# Patient Record
Sex: Female | Born: 1955 | Race: White | Hispanic: No | Smoking: Never smoker
Health system: Southern US, Community
[De-identification: ages and names within clinical notes are randomized; demographics above are authoritative.]

## PROBLEM LIST (undated history)

## (undated) DIAGNOSIS — R51 Headache: Secondary | ICD-10-CM

## (undated) DIAGNOSIS — J449 Chronic obstructive pulmonary disease, unspecified: Secondary | ICD-10-CM

## (undated) DIAGNOSIS — M1990 Unspecified osteoarthritis, unspecified site: Secondary | ICD-10-CM

## (undated) DIAGNOSIS — I10 Essential (primary) hypertension: Secondary | ICD-10-CM

## (undated) DIAGNOSIS — N200 Calculus of kidney: Secondary | ICD-10-CM

## (undated) DIAGNOSIS — F329 Major depressive disorder, single episode, unspecified: Secondary | ICD-10-CM

## (undated) DIAGNOSIS — F32A Depression, unspecified: Secondary | ICD-10-CM

## (undated) DIAGNOSIS — K219 Gastro-esophageal reflux disease without esophagitis: Secondary | ICD-10-CM

## (undated) DIAGNOSIS — R0602 Shortness of breath: Secondary | ICD-10-CM

## (undated) HISTORY — PX: KNEE ARTHROSCOPY: SUR90

## (undated) HISTORY — PX: HEMORRHOID SURGERY: SHX153

## (undated) HISTORY — PX: ABDOMINAL HYSTERECTOMY: SHX81

## (undated) HISTORY — PX: CHOLECYSTECTOMY: SHX55

---

## 2007-12-26 ENCOUNTER — Ambulatory Visit (HOSPITAL_COMMUNITY): Admission: RE | Admit: 2007-12-26 | Discharge: 2007-12-26 | Payer: Self-pay | Admitting: Urology

## 2009-06-14 HISTORY — PX: TOTAL KNEE ARTHROPLASTY: SHX125

## 2012-10-19 DIAGNOSIS — R079 Chest pain, unspecified: Secondary | ICD-10-CM

## 2013-02-12 DIAGNOSIS — N200 Calculus of kidney: Secondary | ICD-10-CM

## 2013-02-12 HISTORY — DX: Calculus of kidney: N20.0

## 2013-05-03 ENCOUNTER — Encounter (HOSPITAL_COMMUNITY)
Admission: RE | Admit: 2013-05-03 | Discharge: 2013-05-03 | Disposition: A | Discharge status: Home / Self Care | Payer: PRIVATE HEALTH INSURANCE | Source: Ambulatory Visit | Attending: Orthopaedic Surgery | Admitting: Orthopaedic Surgery

## 2013-05-03 ENCOUNTER — Encounter (HOSPITAL_COMMUNITY): Payer: Self-pay

## 2013-05-03 ENCOUNTER — Encounter (HOSPITAL_COMMUNITY)
Admission: RE | Admit: 2013-05-03 | Discharge: 2013-05-03 | Disposition: A | Discharge status: Home / Self Care | Payer: PRIVATE HEALTH INSURANCE | Source: Ambulatory Visit | Attending: Orthopedic Surgery | Admitting: Orthopedic Surgery

## 2013-05-03 DIAGNOSIS — Z01818 Encounter for other preprocedural examination: Secondary | ICD-10-CM | POA: Insufficient documentation

## 2013-05-03 DIAGNOSIS — Z01812 Encounter for preprocedural laboratory examination: Secondary | ICD-10-CM | POA: Insufficient documentation

## 2013-05-03 DIAGNOSIS — Z01810 Encounter for preprocedural cardiovascular examination: Secondary | ICD-10-CM | POA: Insufficient documentation

## 2013-05-03 HISTORY — DX: Gastro-esophageal reflux disease without esophagitis: K21.9

## 2013-05-03 HISTORY — DX: Major depressive disorder, single episode, unspecified: F32.9

## 2013-05-03 HISTORY — DX: Unspecified osteoarthritis, unspecified site: M19.90

## 2013-05-03 HISTORY — DX: Depression, unspecified: F32.A

## 2013-05-03 HISTORY — DX: Calculus of kidney: N20.0

## 2013-05-03 HISTORY — DX: Chronic obstructive pulmonary disease, unspecified: J44.9

## 2013-05-03 HISTORY — DX: Shortness of breath: R06.02

## 2013-05-03 HISTORY — DX: Essential (primary) hypertension: I10

## 2013-05-03 HISTORY — DX: Headache: R51

## 2013-05-03 LAB — URINE MICROSCOPIC-ADD ON

## 2013-05-03 LAB — CBC
Hemoglobin: 13.3 g/dL (ref 12.0–15.0)
MCHC: 34.3 g/dL (ref 30.0–36.0)
Platelets: 249 10*3/uL (ref 150–400)
RBC: 4.41 MIL/uL (ref 3.87–5.11)

## 2013-05-03 LAB — URINALYSIS, ROUTINE W REFLEX MICROSCOPIC
Bilirubin Urine: NEGATIVE
Glucose, UA: NEGATIVE mg/dL
Hgb urine dipstick: NEGATIVE
Ketones, ur: NEGATIVE mg/dL
Specific Gravity, Urine: 1.015 (ref 1.005–1.030)
pH: 7.5 (ref 5.0–8.0)

## 2013-05-03 LAB — COMPREHENSIVE METABOLIC PANEL
ALT: 16 U/L (ref 0–35)
AST: 17 U/L (ref 0–37)
Alkaline Phosphatase: 99 U/L (ref 39–117)
GFR calc Af Amer: >90 mL/min (ref >90)
Glucose, Bld: 91 mg/dL (ref 70–99)
Potassium: 3.5 mEq/L (ref 3.5–5.1)
Sodium: 141 mEq/L (ref 135–145)
Total Protein: 7.6 g/dL (ref 6.0–8.3)

## 2013-05-03 LAB — APTT: aPTT: 38 seconds — ABNORMAL HIGH (ref 24–37)

## 2013-05-03 LAB — SURGICAL PCR SCREEN
MRSA, PCR: NEGATIVE
Staphylococcus aureus: NEGATIVE

## 2013-05-03 NOTE — Progress Notes (Signed)
PCP: Dr. Clelia Croft in [place], [place]  States had a stress test at [facility] 2014 due to blood pressure being up/down and sob. Will request. No problems now.  Pt. States surgery is on left knee not the right as per what orders indicate. Left message at Dr. Hoy Register office for clarification.

## 2013-05-03 NOTE — Pre-Procedure Instructions (Signed)
Leah Duncan  05/03/2013   Your procedure is scheduled on:  December 2,2014  Report to [facility] Main Entrance "A" at 11:30 AM.  Call this number if you have problems the morning of surgery: [phone]   Remember:   Do not eat food or drink liquids after midnight.   Take these medicines the morning of surgery with A SIP OF WATER: tylenol, protonix, tramadol   Do not wear jewelry, make-up or nail polish.  Do not wear lotions, powders, or perfumes. You may wear deodorant.  Do not shave 48 hours prior to surgery. Men may shave face and neck.  Do not bring valuables to the hospital.  [facility] is not responsible                  for any belongings or valuables.               Contacts, dentures or bridgework may not be worn into surgery.  Leave suitcase in the car. After surgery it may be brought to your room.  For patients admitted to the hospital, discharge time is determined by your                treatment team.               Patients discharged the day of surgery will not be allowed to drive  home.  Name and phone number of your driver:   Special Instructions: Shower using CHG 2 nights before surgery and the night before surgery.  If you shower the day of surgery use CHG.  Use special wash - you have one bottle of CHG for all showers.  You should use approximately 1/3 of the bottle for each shower.   Please read over the following fact sheets that you were given: Pain Booklet, Coughing and Deep Breathing, Blood Transfusion Information, Total Joint Packet, MRSA Information and Surgical Site Infection Prevention

## 2013-05-04 LAB — URINE CULTURE

## 2013-05-07 NOTE — Progress Notes (Signed)
Urine culture to be re collected day of surgery. Orders entered under sign and held.

## 2013-05-14 NOTE — H&P (Signed)
CHIEF COMPLAINT:  Painful left total knee replacement.   HISTORY OF PRESENT ILLNESS:  Leah Duncan is a very pleasant 57 year old white female who is seen today for evaluation of her left knee.  She apparently has had a left total knee replacement by Dr. Herbert Moors in 2011.  She states that she has never been satisfied with the results of this.  She has had persistent swelling and aching pain which worsens at the end of the day.  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  She is on her feet a lot and she actually started to notice now that she is having problems where she is tripping and falling.  The pain has not gotten to the point where she is having pain and discomfort sometimes with every step.  She has been seen by Dr. Turner Daniels, Dr. Charlett Blake, and Dr. Ophelia Charter.  She also has been seen by Dr. [name] and he has obtained multiple tests in regard to her knee.  She has C-reactive protein and a sed rate which were considered both normal.  She has a normal white count in her blood.  She has a small amount of bacteria in urine.  A 3 phase bone scan revealed hyperemia in the left knee which was not present on a prior study in 2013 was centered on the distal femur.  Blood pool uptake was primarily along the distal femur adjacent to the femoral component.  This was similar to a prior exam, but the late activity showed more uniform uptake in the distal femur and proximal tibia and patella.  It is more intense than it was on the right knee.  There is a suspicion of whether there was loosening in the femoral component.  She has been continuing to have significant pain and discomfort in the knee to the point where it is impeding upon her ability to earn a living.  She is having difficulty with activity of daily living.  Continues to have pain which she grades as moderately severe, more of an aching throbbing with occasional stabbing pain.  She denies any neurovascular compromise.  Seen back today for re-evaluation.  Her general  health is good.     HOSPITALIZATIONS:  For renal calculi, cystoscopy and lithotripsy.  Left total knee arthroplasty 2011.  Hemorrhoidectomy.   CURRENT MEDICATIONS:   Hydrocodone 1-2 tablets daily.  Tramadol 1-2 daily.  Protonix twice daily.  Amitriptyline 10 mg nightly.  Blood pressure pill, she is not sure of the name, but it is 10 mg.   ALLERGIES:    None known.   REVIEW OF SYSTEMS:   A 14-point review of systems is positive for COPD which is exacerbated when she is around a wood stove.  She has had hypertension for the past 1 to 1-1/2 years and is treated with unknown blood pressure tablet.  She does have occasional leg cramps.  She has had renal calculi and the last one was 7 months ago.  She urinates 4-5 times a day because of increased fluid intake.  Nocturia x2.  She does have migraines, but her last one was a year ago.     FAMILY HISTORY:   Positive for her mother who is still alive at age 1 with hypertension.  Father who is deceased of unknown age from COPD and lung disease.  She does have a brother age 25 and sisters at age 80 and 72.  Diabetes is noted on the sisters.   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  She denies history of tobacco or alcohol.   PHYSICAL EXAMINATION:  A 57 year old white female, well-developed, well-nourished, alert, pleasant, and cooperative in moderate distress secondary to left knee pain.  Her height is 5 feet 0 inches.  Weight 172 pounds.  BMI was 33.6.  Vital signs reveal a temperature of 98.8, pulse 85, respirations 16, blood pressure 126/73.     Head:  There is normocephalic.   Eyes:  Pupils equal, round, reactive to light and accommodation with extraocular movements intact.  Ears, nose and throat were benign. Chest had good expansion. Lungs were essentially clear.   Cardiac had a regular rhythm and rate.  Normal S1, S2.  No discreet murmurs, rubs or gallops appreciated.   Pulses were  1+ bilateral and symmetric.   Abdomen:  Scaphoid is soft, nontender, no mass is palpable.  Normal bowel sounds are present. Genital, rectal and breast exam was not indicated for the procedure.  CNS:  Oriented x3 and cranial nerves II-XII grossly intact.    Musculoskeletal:  Today she has range of motion from 0 degrees to 90 degrees.  She does have some crepitus with range of motion.  Trace to 1+ effusion.  Calf is supple and nontender.  Neurovascularly intact distally.   CLINICAL IMPRESSION:    Failed left total knee arthroplasty.   RECOMMENDATIONS:  At this time I have reviewed an evaluation by Dr. Clelia Croft for clearance and he states she is cleared from a medical and cardiac standpoint.     With her desire to consider total knee revision on the left because of persistent pain, we will plan on doing this in the very near future.  Procedure, risk and benefits were fully explained to her and she is understanding.  I demonstrated the prosthesis that we would be using in regard to the metal and the plastic.  All questions were answered.  She is understanding and would like to proceed with the surgery in the near future.  Oris Drone Aleda Grana [facility] [phone]  05/14/2013 2:40 PM

## 2013-05-15 ENCOUNTER — Inpatient Hospital Stay (HOSPITAL_COMMUNITY)
Admission: RE | Admit: 2013-05-15 | Discharge: 2013-05-18 | DRG: 467 | Disposition: A | Discharge status: Home Health | Payer: PRIVATE HEALTH INSURANCE | Source: Ambulatory Visit | Attending: Orthopaedic Surgery | Admitting: Orthopaedic Surgery

## 2013-05-15 ENCOUNTER — Encounter (HOSPITAL_COMMUNITY)
Admission: RE | Disposition: A | Discharge status: Home Health | Payer: Self-pay | Source: Ambulatory Visit | Attending: Orthopaedic Surgery

## 2013-05-15 ENCOUNTER — Ambulatory Visit (HOSPITAL_COMMUNITY): Payer: PRIVATE HEALTH INSURANCE | Admitting: Anesthesiology

## 2013-05-15 ENCOUNTER — Encounter (HOSPITAL_COMMUNITY): Payer: Self-pay | Admitting: Surgery

## 2013-05-15 ENCOUNTER — Encounter (HOSPITAL_COMMUNITY): Payer: PRIVATE HEALTH INSURANCE | Admitting: Anesthesiology

## 2013-05-15 DIAGNOSIS — Z96659 Presence of unspecified artificial knee joint: Secondary | ICD-10-CM

## 2013-05-15 DIAGNOSIS — I10 Essential (primary) hypertension: Secondary | ICD-10-CM | POA: Diagnosis present

## 2013-05-15 DIAGNOSIS — T84099A Other mechanical complication of unspecified internal joint prosthesis, initial encounter: Principal | ICD-10-CM | POA: Diagnosis present

## 2013-05-15 DIAGNOSIS — K219 Gastro-esophageal reflux disease without esophagitis: Secondary | ICD-10-CM | POA: Diagnosis present

## 2013-05-15 DIAGNOSIS — J4489 Other specified chronic obstructive pulmonary disease: Secondary | ICD-10-CM | POA: Diagnosis present

## 2013-05-15 DIAGNOSIS — T8484XD Pain due to internal orthopedic prosthetic devices, implants and grafts, subsequent encounter: Secondary | ICD-10-CM

## 2013-05-15 DIAGNOSIS — F3289 Other specified depressive episodes: Secondary | ICD-10-CM | POA: Diagnosis present

## 2013-05-15 DIAGNOSIS — Y831 Surgical operation with implant of artificial internal device as the cause of abnormal reaction of the patient, or of later complication, without mention of misadventure at the time of the procedure: Secondary | ICD-10-CM | POA: Diagnosis present

## 2013-05-15 DIAGNOSIS — Z79899 Other long term (current) drug therapy: Secondary | ICD-10-CM

## 2013-05-15 DIAGNOSIS — D62 Acute posthemorrhagic anemia: Secondary | ICD-10-CM | POA: Diagnosis not present

## 2013-05-15 DIAGNOSIS — F329 Major depressive disorder, single episode, unspecified: Secondary | ICD-10-CM | POA: Diagnosis present

## 2013-05-15 DIAGNOSIS — T8484XA Pain due to internal orthopedic prosthetic devices, implants and grafts, initial encounter: Secondary | ICD-10-CM

## 2013-05-15 DIAGNOSIS — J449 Chronic obstructive pulmonary disease, unspecified: Secondary | ICD-10-CM | POA: Diagnosis present

## 2013-05-15 DIAGNOSIS — T84093A Other mechanical complication of internal left knee prosthesis, initial encounter: Secondary | ICD-10-CM

## 2013-05-15 HISTORY — PX: TOTAL KNEE REVISION: SHX996

## 2013-05-15 HISTORY — PX: TOTAL KNEE ARTHROPLASTY: SHX125

## 2013-05-15 LAB — URINALYSIS, ROUTINE W REFLEX MICROSCOPIC
Glucose, UA: NEGATIVE mg/dL
Hgb urine dipstick: NEGATIVE
Ketones, ur: NEGATIVE mg/dL
Nitrite: NEGATIVE
Specific Gravity, Urine: 1.013 (ref 1.005–1.030)
Urobilinogen, UA: 0.2 mg/dL (ref 0.0–1.0)

## 2013-05-15 LAB — URINE MICROSCOPIC-ADD ON

## 2013-05-15 SURGERY — TOTAL KNEE REVISION
Anesthesia: Regional | Site: Knee | Laterality: Left

## 2013-05-15 MED ORDER — FENTANYL CITRATE 0.05 MG/ML IJ SOLN
INTRAMUSCULAR | Status: AC
  Administered 2013-05-15: 50 ug via INTRAVENOUS
  Filled 2013-05-15: qty 2

## 2013-05-15 MED ORDER — AMITRIPTYLINE HCL 10 MG PO TABS
10.0000 mg | ORAL_TABLET | Freq: Every day | ORAL | Status: DC
  Administered 2013-05-15 – 2013-05-17 (×3): 10 mg via ORAL
  Filled 2013-05-15 (×4): qty 1

## 2013-05-15 MED ORDER — MIDAZOLAM HCL 5 MG/5ML IJ SOLN
INTRAMUSCULAR | Status: DC | PRN
  Administered 2013-05-15: 2 mg via INTRAVENOUS

## 2013-05-15 MED ORDER — PANTOPRAZOLE SODIUM 40 MG PO TBEC
40.0000 mg | DELAYED_RELEASE_TABLET | Freq: Two times a day (BID) | ORAL | Status: DC
  Administered 2013-05-15 – 2013-05-18 (×6): 40 mg via ORAL
  Filled 2013-05-15 (×6): qty 1

## 2013-05-15 MED ORDER — FENTANYL CITRATE 0.05 MG/ML IJ SOLN
INTRAMUSCULAR | Status: DC | PRN
  Administered 2013-05-15: 25 ug via INTRAVENOUS
  Administered 2013-05-15 (×2): 50 ug via INTRAVENOUS
  Administered 2013-05-15: 25 ug via INTRAVENOUS
  Administered 2013-05-15: 100 ug via INTRAVENOUS

## 2013-05-15 MED ORDER — CHLORHEXIDINE GLUCONATE 4 % EX LIQD: 60.0000 mL | Freq: Once | CUTANEOUS | Status: DC

## 2013-05-15 MED ORDER — PHENOL 1.4 % MT LIQD: 1.0000 | OROMUCOSAL | Status: DC | PRN

## 2013-05-15 MED ORDER — ONDANSETRON HCL 4 MG/2ML IJ SOLN
INTRAMUSCULAR | Status: DC | PRN
  Administered 2013-05-15: 4 mg via INTRAVENOUS

## 2013-05-15 MED ORDER — ONDANSETRON HCL 4 MG/2ML IJ SOLN
4.0000 mg | Freq: Four times a day (QID) | INTRAMUSCULAR | Status: AC | PRN
  Administered 2013-05-15: 4 mg via INTRAVENOUS

## 2013-05-15 MED ORDER — MENTHOL 3 MG MT LOZG: 1.0000 | LOZENGE | OROMUCOSAL | Status: DC | PRN

## 2013-05-15 MED ORDER — SODIUM CHLORIDE 0.9 % IR SOLN
Status: DC | PRN
  Administered 2013-05-15: 3000 mL

## 2013-05-15 MED ORDER — HYDROMORPHONE HCL PF 1 MG/ML IJ SOLN: 0.5000 mg | INTRAMUSCULAR | Status: DC | PRN

## 2013-05-15 MED ORDER — METOCLOPRAMIDE HCL 10 MG PO TABS
5.0000 mg | ORAL_TABLET | Freq: Three times a day (TID) | ORAL | Status: DC | PRN
  Administered 2013-05-17 – 2013-05-18 (×2): 10 mg via ORAL
  Filled 2013-05-15 (×2): qty 1

## 2013-05-15 MED ORDER — FLEET ENEMA 7-19 GM/118ML RE ENEM: 1.0000 | ENEMA | Freq: Once | RECTAL | Status: AC | PRN

## 2013-05-15 MED ORDER — ACETAMINOPHEN 325 MG PO TABS
650.0000 mg | ORAL_TABLET | Freq: Four times a day (QID) | ORAL | Status: DC | PRN
  Administered 2013-05-16: 650 mg via ORAL
  Filled 2013-05-15: qty 2

## 2013-05-15 MED ORDER — CHLORHEXIDINE GLUCONATE 4 % EX LIQD: 60.0000 mL | Freq: Every day | CUTANEOUS | Status: DC

## 2013-05-15 MED ORDER — ACETAMINOPHEN 500 MG PO TABS
1000.0000 mg | ORAL_TABLET | Freq: Once | ORAL | Status: AC
  Administered 2013-05-15: 1000 mg via ORAL

## 2013-05-15 MED ORDER — VANCOMYCIN HCL 1000 MG IV SOLR
1000.0000 mg | Freq: Two times a day (BID) | INTRAVENOUS | Status: DC
  Administered 2013-05-15: 1000 mg via INTRAVENOUS
  Filled 2013-05-15: qty 1000

## 2013-05-15 MED ORDER — RIVAROXABAN 10 MG PO TABS
10.0000 mg | ORAL_TABLET | Freq: Every day | ORAL | Status: DC
  Administered 2013-05-16 – 2013-05-18 (×3): 10 mg via ORAL
  Filled 2013-05-15 (×3): qty 1

## 2013-05-15 MED ORDER — ALUM & MAG HYDROXIDE-SIMETH 200-200-20 MG/5ML PO SUSP: 30.0000 mL | ORAL | Status: DC | PRN

## 2013-05-15 MED ORDER — VANCOMYCIN HCL IN DEXTROSE 1-5 GM/200ML-% IV SOLN
1000.0000 mg | Freq: Two times a day (BID) | INTRAVENOUS | Status: DC
  Filled 2013-05-15 (×2): qty 200

## 2013-05-15 MED ORDER — LISINOPRIL 10 MG PO TABS
10.0000 mg | ORAL_TABLET | Freq: Every day | ORAL | Status: DC
  Administered 2013-05-15 – 2013-05-16 (×2): 10 mg via ORAL
  Filled 2013-05-15 (×5): qty 1

## 2013-05-15 MED ORDER — METHOCARBAMOL 100 MG/ML IJ SOLN
500.0000 mg | Freq: Four times a day (QID) | INTRAVENOUS | Status: DC | PRN
  Filled 2013-05-15: qty 5

## 2013-05-15 MED ORDER — HYDROMORPHONE HCL PF 1 MG/ML IJ SOLN
INTRAMUSCULAR | Status: AC
  Administered 2013-05-15: 0.5 mg via INTRAVENOUS
  Filled 2013-05-15: qty 1

## 2013-05-15 MED ORDER — BUPIVACAINE-EPINEPHRINE PF 0.25-1:200000 % IJ SOLN
INTRAMUSCULAR | Status: DC | PRN
  Administered 2013-05-15: 30 mL

## 2013-05-15 MED ORDER — MIDAZOLAM HCL 2 MG/2ML IJ SOLN
1.0000 mg | INTRAMUSCULAR | Status: DC | PRN
  Administered 2013-05-15: 1 mg via INTRAVENOUS

## 2013-05-15 MED ORDER — SODIUM CHLORIDE 0.9 % IV SOLN
INTRAVENOUS | Status: DC
  Administered 2013-05-15: 21:00:00 via INTRAVENOUS

## 2013-05-15 MED ORDER — KETOROLAC TROMETHAMINE 30 MG/ML IJ SOLN
INTRAMUSCULAR | Status: AC
  Administered 2013-05-15: 15 mg
  Filled 2013-05-15: qty 1

## 2013-05-15 MED ORDER — MAGNESIUM HYDROXIDE 400 MG/5ML PO SUSP: 30.0000 mL | Freq: Every day | ORAL | Status: DC | PRN

## 2013-05-15 MED ORDER — VANCOMYCIN HCL IN DEXTROSE 1-5 GM/200ML-% IV SOLN
1000.0000 mg | Freq: Two times a day (BID) | INTRAVENOUS | Status: AC
  Administered 2013-05-16 – 2013-05-17 (×3): 1000 mg via INTRAVENOUS
  Filled 2013-05-15 (×3): qty 200

## 2013-05-15 MED ORDER — METHOCARBAMOL 500 MG PO TABS
500.0000 mg | ORAL_TABLET | Freq: Four times a day (QID) | ORAL | Status: DC | PRN
  Administered 2013-05-15 – 2013-05-17 (×6): 500 mg via ORAL
  Filled 2013-05-15 (×6): qty 1

## 2013-05-15 MED ORDER — ONDANSETRON HCL 4 MG/2ML IJ SOLN
INTRAMUSCULAR | Status: AC
  Administered 2013-05-15: 4 mg via INTRAVENOUS
  Filled 2013-05-15: qty 2

## 2013-05-15 MED ORDER — OXYCODONE HCL 5 MG PO TABS
5.0000 mg | ORAL_TABLET | ORAL | Status: DC | PRN
  Administered 2013-05-16 – 2013-05-18 (×11): 10 mg via ORAL
  Filled 2013-05-15 (×12): qty 2

## 2013-05-15 MED ORDER — LACTATED RINGERS IV SOLN
INTRAVENOUS | Status: DC
Start: 2013-05-15 — End: 2013-05-15
  Administered 2013-05-15: 12:00:00 via INTRAVENOUS

## 2013-05-15 MED ORDER — OXYCODONE HCL 5 MG/5ML PO SOLN
ORAL | Status: AC
  Administered 2013-05-15: 5 mg via ORAL
  Filled 2013-05-15: qty 5

## 2013-05-15 MED ORDER — LIDOCAINE HCL (CARDIAC) 20 MG/ML IV SOLN
INTRAVENOUS | Status: DC | PRN
  Administered 2013-05-15: 80 mg via INTRAVENOUS

## 2013-05-15 MED ORDER — BUPIVACAINE-EPINEPHRINE (PF) 0.25% -1:200000 IJ SOLN
INTRAMUSCULAR | Status: AC
  Filled 2013-05-15: qty 30

## 2013-05-15 MED ORDER — ARTIFICIAL TEARS OP OINT
TOPICAL_OINTMENT | OPHTHALMIC | Status: DC | PRN
  Administered 2013-05-15: 1 via OPHTHALMIC

## 2013-05-15 MED ORDER — VANCOMYCIN HCL IN DEXTROSE 1-5 GM/200ML-% IV SOLN
INTRAVENOUS | Status: AC
  Filled 2013-05-15: qty 200

## 2013-05-15 MED ORDER — ONDANSETRON HCL 4 MG/2ML IJ SOLN
4.0000 mg | Freq: Four times a day (QID) | INTRAMUSCULAR | Status: DC | PRN
  Administered 2013-05-17: 4 mg via INTRAVENOUS
  Filled 2013-05-15: qty 2

## 2013-05-15 MED ORDER — OXYCODONE HCL 5 MG PO TABS: 5.0000 mg | ORAL_TABLET | Freq: Once | ORAL | Status: AC | PRN

## 2013-05-15 MED ORDER — ACETAMINOPHEN 650 MG RE SUPP: 650.0000 mg | Freq: Four times a day (QID) | RECTAL | Status: DC | PRN

## 2013-05-15 MED ORDER — BISACODYL 10 MG RE SUPP: 10.0000 mg | Freq: Every day | RECTAL | Status: DC | PRN

## 2013-05-15 MED ORDER — ESMOLOL HCL 10 MG/ML IV SOLN
INTRAVENOUS | Status: DC | PRN
  Administered 2013-05-15: 10 mg via INTRAVENOUS

## 2013-05-15 MED ORDER — DOCUSATE SODIUM 100 MG PO CAPS
100.0000 mg | ORAL_CAPSULE | Freq: Two times a day (BID) | ORAL | Status: DC
  Administered 2013-05-15 – 2013-05-18 (×6): 100 mg via ORAL
  Filled 2013-05-15 (×7): qty 1

## 2013-05-15 MED ORDER — HYDROMORPHONE HCL PF 1 MG/ML IJ SOLN
0.5000 mg | INTRAMUSCULAR | Status: AC | PRN
  Administered 2013-05-15 (×4): 0.5 mg via INTRAVENOUS

## 2013-05-15 MED ORDER — FENTANYL CITRATE 0.05 MG/ML IJ SOLN
50.0000 ug | INTRAMUSCULAR | Status: DC | PRN
  Administered 2013-05-15: 50 ug via INTRAVENOUS

## 2013-05-15 MED ORDER — LACTATED RINGERS IV SOLN
INTRAVENOUS | Status: DC | PRN
  Administered 2013-05-15 (×2): via INTRAVENOUS

## 2013-05-15 MED ORDER — OXYCODONE HCL 5 MG/5ML PO SOLN
5.0000 mg | Freq: Once | ORAL | Status: AC | PRN
  Administered 2013-05-15: 5 mg via ORAL

## 2013-05-15 MED ORDER — PROPOFOL 10 MG/ML IV BOLUS
INTRAVENOUS | Status: DC | PRN
  Administered 2013-05-15: 150 mg via INTRAVENOUS

## 2013-05-15 MED ORDER — METOCLOPRAMIDE HCL 5 MG/ML IJ SOLN: 5.0000 mg | Freq: Three times a day (TID) | INTRAMUSCULAR | Status: DC | PRN

## 2013-05-15 MED ORDER — MIDAZOLAM HCL 2 MG/2ML IJ SOLN
INTRAMUSCULAR | Status: AC
  Administered 2013-05-15: 1 mg via INTRAVENOUS
  Filled 2013-05-15: qty 2

## 2013-05-15 MED ORDER — BUPIVACAINE-EPINEPHRINE PF 0.5-1:200000 % IJ SOLN
INTRAMUSCULAR | Status: DC | PRN
  Administered 2013-05-15: 30 mL

## 2013-05-15 MED ORDER — SODIUM CHLORIDE 0.9 % IV SOLN: INTRAVENOUS | Status: DC

## 2013-05-15 MED ORDER — FENTANYL CITRATE 0.05 MG/ML IJ SOLN: 25.0000 ug | INTRAMUSCULAR | Status: DC | PRN

## 2013-05-15 MED ORDER — ACETAMINOPHEN 500 MG PO TABS
ORAL_TABLET | ORAL | Status: AC
  Administered 2013-05-15: 1000 mg via ORAL
  Filled 2013-05-15: qty 2

## 2013-05-15 MED ORDER — ONDANSETRON HCL 4 MG PO TABS: 4.0000 mg | ORAL_TABLET | Freq: Four times a day (QID) | ORAL | Status: DC | PRN

## 2013-05-15 MED ORDER — KETOROLAC TROMETHAMINE 15 MG/ML IJ SOLN
15.0000 mg | Freq: Four times a day (QID) | INTRAMUSCULAR | Status: AC
  Filled 2013-05-15: qty 1

## 2013-05-15 SURGICAL SUPPLY — 63 items
ANCHOR PEEK ALL THREAD (Anchor) ×2 IMPLANT
AUGMENT DIST PFC 4MM (Knees) ×1 IMPLANT
BANDAGE ESMARK 6X9 LF (GAUZE/BANDAGES/DRESSINGS) ×1 IMPLANT
BLADE SAGITTAL 25.0X1.19X90 (BLADE) ×2 IMPLANT
BLADE SAW SGTL 13.0X1.19X90.0M (BLADE) ×2 IMPLANT
BNDG ESMARK 6X9 LF (GAUZE/BANDAGES/DRESSINGS) ×2
BONE CEMENT GENTAMICIN (Cement) ×6 IMPLANT
BOWL SMART MIX CTS (DISPOSABLE) IMPLANT
CEMENT BONE GENTAMICIN 40 (Cement) ×3 IMPLANT
CEMENT RESTRICTOR DEPUY SZ 3 (Cement) ×2 IMPLANT
CLOTH BEACON ORANGE TIMEOUT ST (SAFETY) ×2 IMPLANT
COVER SURGICAL LIGHT HANDLE (MISCELLANEOUS) ×2 IMPLANT
CUFF TOURNIQUET SINGLE 34IN LL (TOURNIQUET CUFF) IMPLANT
CUFF TOURNIQUET SINGLE 44IN (TOURNIQUET CUFF) IMPLANT
DISAL AUG PFC 4MM (Knees) ×2 IMPLANT
DRAPE EXTREMITY T 121X128X90 (DRAPE) ×2 IMPLANT
DRSG ADAPTIC 3X8 NADH LF (GAUZE/BANDAGES/DRESSINGS) ×2 IMPLANT
DRSG PAD ABDOMINAL 8X10 ST (GAUZE/BANDAGES/DRESSINGS) ×2 IMPLANT
DURAPREP 26ML APPLICATOR (WOUND CARE) ×2 IMPLANT
ELECT REM PT RETURN 9FT ADLT (ELECTROSURGICAL) ×2
ELECTRODE REM PT RTRN 9FT ADLT (ELECTROSURGICAL) ×1 IMPLANT
EVACUATOR 1/8 PVC DRAIN (DRAIN) IMPLANT
FACESHIELD LNG OPTICON STERILE (SAFETY) ×4 IMPLANT
FEMORAL ADAPTER (Orthopedic Implant) ×2 IMPLANT
FEMORAL PFC TC3 (Orthopedic Implant) ×2 IMPLANT
GLOVE BIOGEL PI IND STRL 8 (GLOVE) ×1 IMPLANT
GLOVE BIOGEL PI IND STRL 8.5 (GLOVE) IMPLANT
GLOVE BIOGEL PI INDICATOR 8 (GLOVE) ×1
GLOVE BIOGEL PI INDICATOR 8.5 (GLOVE)
GLOVE ECLIPSE 8.0 STRL XLNG CF (GLOVE) ×2 IMPLANT
GLOVE SURG ORTHO 8.5 STRL (GLOVE) ×2 IMPLANT
GOWN PREVENTION PLUS XLARGE (GOWN DISPOSABLE) ×2 IMPLANT
GOWN PREVENTION PLUS XXLARGE (GOWN DISPOSABLE) ×2 IMPLANT
GOWN STRL NON-REIN LRG LVL3 (GOWN DISPOSABLE) ×4 IMPLANT
HANDPIECE INTERPULSE COAX TIP (DISPOSABLE) ×1
INSERT TC3 RP TIBIAL 2.5 20MM (Knees) ×2 IMPLANT
KIT BASIN OR (CUSTOM PROCEDURE TRAY) ×2 IMPLANT
KIT ROOM TURNOVER OR (KITS) ×2 IMPLANT
MANIFOLD NEPTUNE II (INSTRUMENTS) ×2 IMPLANT
NEEDLE 22X1 1/2 (OR ONLY) (NEEDLE) IMPLANT
NS IRRIG 1000ML POUR BTL (IV SOLUTION) ×2 IMPLANT
PACK TOTAL JOINT (CUSTOM PROCEDURE TRAY) ×2 IMPLANT
PAD ARMBOARD 7.5X6 YLW CONV (MISCELLANEOUS) ×4 IMPLANT
PAD CAST 4YDX4 CTTN HI CHSV (CAST SUPPLIES) ×1 IMPLANT
PADDING CAST COTTON 4X4 STRL (CAST SUPPLIES) ×1
PATELLA DOME PFC 38MM (Knees) ×2 IMPLANT
POST AUG PFC 8MM SZ 2.5 (Knees) ×4 IMPLANT
SET HNDPC FAN SPRY TIP SCT (DISPOSABLE) ×1 IMPLANT
SPONGE GAUZE 4X4 12PLY (GAUZE/BANDAGES/DRESSINGS) ×2 IMPLANT
STAPLER VISISTAT 35W (STAPLE) ×2 IMPLANT
STEM TIBIA PFC 13X30MM (Stem) ×2 IMPLANT
STEM UNIVERSAL REVISION 75X14 (Stem) ×2 IMPLANT
SUCTION FRAZIER TIP 10 FR DISP (SUCTIONS) ×2 IMPLANT
SUT BONE WAX W31G (SUTURE) ×2 IMPLANT
SUT ETHIBOND NAB CT1 #1 30IN (SUTURE) ×8 IMPLANT
SUT VIC AB 0 CT1 27 (SUTURE) ×1
SUT VIC AB 0 CT1 27XBRD ANBCTR (SUTURE) ×1 IMPLANT
SUT VIC AB 2-0 FS1 27 (SUTURE) ×4 IMPLANT
SYR CONTROL 10ML LL (SYRINGE) IMPLANT
TOWER CARTRIDGE SMART MIX (DISPOSABLE) ×2 IMPLANT
TRAY FOLEY CATH 16FRSI W/METER (SET/KITS/TRAYS/PACK) ×2 IMPLANT
TRAY TIB SZ 2 REVISION (Knees) ×2 IMPLANT
WATER STERILE IRR 1000ML POUR (IV SOLUTION) ×6 IMPLANT

## 2013-05-15 NOTE — Anesthesia Procedure Notes (Addendum)
Anesthesia Regional Block:  Femoral nerve block  Pre-Anesthetic Checklist: ,, timeout performed, Correct Patient, Correct Site, Correct Laterality, Correct Procedure,, site marked, risks and benefits discussed, Surgical consent,  Pre-op evaluation,  At surgeon's request and post-op pain management  Laterality: Left  Prep: chloraprep       Needles:  Injection technique: Single-shot  Needle Type: Echogenic Stimulator Needle     Needle Length: 9cm  Needle Gauge: 21 and 21 G    Additional Needles:  Procedures: nerve stimulator Femoral nerve block  Nerve Stimulator or Paresthesia:  Response: Quadriceps muscle contraction, 0.45 mA,   Additional Responses:   Narrative:  Start time: 05/15/2013 12:47 PM End time: 05/15/2013 1:01 PM Injection made incrementally with aspirations every 5 mL.  Performed by: Personally  Anesthesiologist: Dr Chaney Malling  Additional Notes: Functioning IV was confirmed and monitors were applied.  A 90mm 21ga Arrow echogenic stimulator needle was used. Sterile prep and drape,hand hygiene and sterile gloves were used.  Negative aspiration and negative test dose prior to incremental administration of local anesthetic. The patient tolerated the procedure well.     Procedure Name: LMA Insertion Date/Time: 05/15/2013 1:53 PM Performed by: Marena Chancy Pre-anesthesia Checklist: Patient identified, Patient being monitored, Timeout performed, Emergency Drugs available and Suction available Patient Re-evaluated:Patient Re-evaluated prior to inductionOxygen Delivery Method: Circle system utilized Preoxygenation: Pre-oxygenation with 100% oxygen Intubation Type: IV induction LMA: LMA inserted LMA Size: 4.0 Number of attempts: 1 Placement Confirmation: breath sounds checked- equal and bilateral and positive ETCO2 Tube secured with: Tape Dental Injury: Teeth and Oropharynx as per pre-operative assessment

## 2013-05-15 NOTE — Preoperative (Signed)
Beta Blockers   Reason not to administer Beta Blockers:Not Applicable 

## 2013-05-15 NOTE — Progress Notes (Signed)
Patient ID: Leah Duncan, female   DOB: 1956/06/03, 57 y.o.   MRN: [id number] The recent History & Physical has been reviewed. I have personally examined the patient today. There is no interval change to the documented History & Physical. The patient would like to proceed with the procedure.  [name] W 05/15/2013,  1:06 PM

## 2013-05-15 NOTE — Transfer of Care (Signed)
Immediate Anesthesia Transfer of Care Note  Patient: Leah Duncan  Procedure(s) Performed: Procedure(s): TOTAL KNEE REVISION (Left)  Patient Location: PACU  Anesthesia Type:General  Level of Consciousness: awake, alert  and oriented  Airway & Oxygen Therapy: Patient Spontanous Breathing and Patient connected to nasal cannula oxygen  Post-op Assessment: Report given to PACU RN, Post -op Vital signs reviewed and stable and Patient moving all extremities X 4  Post vital signs: Reviewed and stable  Complications: No apparent anesthesia complications

## 2013-05-15 NOTE — Progress Notes (Signed)
Orthopedic Tech Progress Note Patient Details:  Leah Duncan 01/23/1956 [id number]  CPM Left Knee CPM Left Knee: On Left Knee Flexion (Degrees): 40 Left Knee Extension (Degrees): 0 Additional Comments: put on ohf on bed   Jennye Moccasin 05/15/2013, 6:43 PM

## 2013-05-15 NOTE — Anesthesia Postprocedure Evaluation (Signed)
Anesthesia Post Note  Patient: Leah Duncan  Procedure(s) Performed: Procedure(s) (LRB): TOTAL KNEE REVISION (Left)  Anesthesia type: General  Patient location: PACU  Post pain: Pain level controlled and Adequate analgesia  Post assessment: Post-op Vital signs reviewed, Patient's Cardiovascular Status Stable, Respiratory Function Stable, Patent Airway and Pain level controlled  Last Vitals:  Filed Vitals:   05/15/13 1745  BP: 113/85  Pulse: 83  Temp:   Resp: 11    Post vital signs: Reviewed and stable  Level of consciousness: awake, alert  and oriented  Complications: No apparent anesthesia complications

## 2013-05-15 NOTE — Op Note (Signed)
PATIENT ID:      Leah Duncan  MRN:     [id number] DOB/AGE:    [birth year] / 57 y.o.       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                                                                     There is no weight on file to calculate BMI.34     PROCEDURE:  Procedure(s): TOTAL KNEE REVISION left     SURGEON:  [name], MD    ASSISTANT:   [name], PA-C   (Present and scrubbed throughout the case, critical for assistance with exposure, retraction, instrumentation, and closure.)          ANESTHESIA: regional and general     DRAINS: (left knee) Hemovact drain(s) in the clamped with  Suction Clamped :      TOURNIQUET TIME: * Missing tourniquet times found for documented tourniquets in log:  [id number] * Total Tourniquet Time Documented: Thigh (Left) - 104 minutes Total: Thigh (Left) - 104 minutes  second tourniquet 22 min   COMPLICATIONS:  None   CONDITION:  stable  PROCEDURE IN DETAIL: 211968   [name], [name] 05/15/2013, 5:09 PM

## 2013-05-15 NOTE — Anesthesia Preprocedure Evaluation (Signed)
Anesthesia Evaluation  Patient identified by MRN, date of birth, ID band Patient awake    Reviewed: Allergy & Precautions, H&P , NPO status , Patient's Chart, lab work & pertinent test results  Airway Mallampati: II  Neck ROM: full    Dental   Pulmonary shortness of breath, COPD         Cardiovascular hypertension,     Neuro/Psych  Headaches, Depression    GI/Hepatic GERD-  ,  Endo/Other  obese  Renal/GU      Musculoskeletal  (+) Arthritis -,   Abdominal   Peds  Hematology   Anesthesia Other Findings   Reproductive/Obstetrics                           Anesthesia Physical Anesthesia Plan  ASA: II  Anesthesia Plan: General and Regional   Post-op Pain Management: MAC Combined w/ Regional for Post-op pain   Induction: Intravenous  Airway Management Planned: LMA  Additional Equipment:   Intra-op Plan:   Post-operative Plan:   Informed Consent: I have reviewed the patients History and Physical, chart, labs and discussed the procedure including the risks, benefits and alternatives for the proposed anesthesia with the patient or authorized representative who has indicated his/her understanding and acceptance.     Plan Discussed with: CRNA, Anesthesiologist and Surgeon  Anesthesia Plan Comments:         Anesthesia Quick Evaluation

## 2013-05-16 ENCOUNTER — Encounter (HOSPITAL_COMMUNITY): Payer: Self-pay | Admitting: General Practice

## 2013-05-16 LAB — URINE CULTURE
Colony Count: NO GROWTH
Special Requests: NORMAL

## 2013-05-16 LAB — BASIC METABOLIC PANEL
CO2: 27 mEq/L (ref 19–32)
Calcium: 8.9 mg/dL (ref 8.4–10.5)
Potassium: 4.8 mEq/L (ref 3.5–5.1)
Sodium: 138 mEq/L (ref 135–145)

## 2013-05-16 LAB — CBC
Hemoglobin: 9 g/dL — ABNORMAL LOW (ref 12.0–15.0)
Hemoglobin: 8.1 g/dL — ABNORMAL LOW (ref 12.0–15.0)
MCH: 30 pg (ref 26.0–34.0)
MCH: 29.7 pg (ref 26.0–34.0)
MCV: 89.7 fL (ref 78.0–100.0)
Platelets: 173 10*3/uL (ref 150–400)
Platelets: 176 10*3/uL (ref 150–400)
RBC: 3 MIL/uL — ABNORMAL LOW (ref 3.87–5.11)
RBC: 2.73 MIL/uL — ABNORMAL LOW (ref 3.87–5.11)
RDW: 14.2 % (ref 11.5–15.5)
WBC: 8.6 10*3/uL (ref 4.0–10.5)
WBC: 8.5 10*3/uL (ref 4.0–10.5)

## 2013-05-16 MED ORDER — KETOROLAC TROMETHAMINE 30 MG/ML IJ SOLN
15.0000 mg | INTRAMUSCULAR | Status: AC
  Administered 2013-05-16: 15 mg via INTRAVENOUS
  Filled 2013-05-16: qty 1

## 2013-05-16 NOTE — Evaluation (Signed)
Physical Therapy Evaluation Patient Details Name: Leah Duncan MRN: [id number] DOB: 08-Jun-1956 Today's Date: 05/16/2013 Time: 4098-1191 PT Time Calculation (min): 16 min  PT Assessment / Plan / Recommendation History of Present Illness  PAINFUL, failed left total knee now s/p Left Total knee Revision.  Clinical Impression  Pt is s/p TKA resulting in the deficits listed below (see PT Problem List).  Pt will benefit from skilled PT to increase their independence and safety with mobility to allow discharge to the venue listed below.      PT Assessment  Patient needs continued PT services    Follow Up Recommendations  Home health PT;Supervision/Assistance - 24 hour    Does the patient have the potential to tolerate intense rehabilitation      Barriers to Discharge        Equipment Recommendations  Rolling walker with 5" wheels;3in1 (PT)    Recommendations for Other Services OT consult   Frequency 7X/week    Precautions / Restrictions Precautions Precautions: Fall;Knee Restrictions LLE Weight Bearing: Partial weight bearing LLE Partial Weight Bearing Percentage or Pounds: 50%   Pertinent Vitals/Pain L knee pain, did not rate; patient repositioned for comfort in CPM Reported nausea      Mobility  Bed Mobility Bed Mobility: Supine to Sit;Sitting - Scoot to Edge of Bed Supine to Sit: 3: Mod assist;HOB flat;With rails Sitting - Scoot to Edge of Bed: 3: Mod assist;With rail Details for Bed Mobility Assistance: Assist for LE and safety/sequencing of all mobility and transfers Transfers Transfers: Sit to Stand;Stand to Sit Sit to Stand: From bed;With upper extremity assist;With armrests;4: Min assist Stand to Sit: To chair/3-in-1;With upper extremity assist;4: Min assist Details for Transfer Assistance: VC's for safety, sequencing and hand placement w/ RW. Pt reaching to chair w/ one hand on RW x4 despite maximal verbal and tactile cues for hand placement. Pt currently not  safe w/ SPT, this could be medication related, cont to assess. Ambulation/Gait Ambulation/Gait Assistance: 3: Mod assist Ambulation Distance (Feet): 6 Feet Assistive device: Rolling walker Ambulation/Gait Assistance Details: Cues for gait sequence, posture and correct holding of RW through grips for bearing down with UEs into RW to unweight LLE for 50% PWB; pt tending to lean down onto rW with elbows; Amb distance limited by nausea; Required mod phsyical assist to advance RW Gait Pattern: Step-to pattern    Exercises Total Joint Exercises Ankle Circles/Pumps: AROM;Both;10 reps Quad Sets: AROM;Left;10 reps Heel Slides: AAROM;Left;10 reps Straight Leg Raises: AAROM;Left;5 reps   PT Diagnosis: Difficulty walking;Acute pain  PT Problem List: Decreased strength;Decreased range of motion;Decreased activity tolerance;Decreased balance;Decreased mobility;Decreased knowledge of use of DME;Pain;Decreased knowledge of precautions;Decreased safety awareness PT Treatment Interventions: DME instruction;Gait training;Stair training;Functional mobility training;Therapeutic activities;Therapeutic exercise;Patient/family education     PT Goals(Current goals can be found in the care plan section) Acute Rehab PT Goals Patient Stated Goal: Home w/ therapy and PRN assist from mother/family PT Goal Formulation: With patient Time For Goal Achievement: 05/23/13 Potential to Achieve Goals: Good  Visit Information  Last PT Received On: 05/16/13 Assistance Needed: +1 (he+2lpful to push chair) Reason Eval/Treat Not Completed: Patient declined, no reason specified History of Present Illness: PAINFUL, failed left total knee now s/p Left Total knee Revision.       Prior Functioning  Home Living Family/patient expects to be discharged to:: Private residence Living Arrangements: Parent;Other relatives Available Help at Discharge: Family;Available 24 hours/day Type of Home: House Home Access: Stairs to  enter [employer] of Steps: 1 Home  Layout: One level Home Equipment: Walker - 2 wheels;Bedside commode;Shower seat;Adaptive equipment Adaptive Equipment: Reacher;Sock aid;Long-handled shoe horn;Long-handled sponge Prior Function Level of Independence: Independent Communication Communication: No difficulties Dominant Hand: Right    Cognition  Cognition Arousal/Alertness: Awake/alert;Lethargic Behavior During Therapy: WFL for tasks assessed/performed Overall Cognitive Status: Within Functional Limits for tasks assessed    Extremity/Trunk Assessment Upper Extremity Assessment Upper Extremity Assessment: Overall WFL for tasks assessed Lower Extremity Assessment Lower Extremity Assessment: LLE deficits/detail LLE Deficits / Details: Grossly decr AROM and strength, limited by pain postop; Muscle guarding with passive flexion   Balance    End of Session PT - End of Session Equipment Utilized During Treatment: Gait belt Activity Tolerance: Patient tolerated treatment well Patient left: in bed;in CPM;with call bell/phone within reach Nurse Communication: Mobility status CPM Left Knee CPM Left Knee: On  GP     Olen Pel [place], [place] [phone]  05/16/2013, 2:18 PM

## 2013-05-16 NOTE — Op Note (Signed)
Leah Duncan, Leah Duncan               ACCOUNT NO.:  [id number]  MEDICAL RECORD NO.:  [id number]  LOCATION:  5N13C                        FACILITY:  [facility]  PHYSICIAN:  Claude Manges. 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  PROCEDURE: 1. EXPLORATION LEFT KNEE WITH REMOVAL OF LEFT TOTAL KNEE REPLACEMENT. 2. FROZEN SECTION DEEP SYNOVIUM. 3. REVISION LEFT TOTAL KNEE REPLACEMENT.  SURGEON:  Claude Manges. [name], MD.  ASSISTANT:  [name], PA-C was present throughout the operative procedure to ensure its timely completion.  ANESTHESIA:  General with supplemental left femoral nerve block.  COMPLICATIONS:  None.  COMPONENTS:  A Stryker knee replacement was removed.  I inserted a DePuy Sigma knee revision system.  On the tibial side, there was a #2 tibial tray with a 13 mm x 30 mm modular stem, the bone components were cemented.  I did use a cement restrictor size 3.  On the femoral side, I used a TC3 size 2.5.  I used two 8-mm posterior metallic augments and a 4-mm lateral distal augment and those components were also cemented using gentamicin impregnated cement.  I used a 20-mm polyethylene rotating tibial tray and an oval dome patella 3 PEG 38 mm in size.  PROCEDURE:  Leah Duncan was met in the holding area, identified the left knee as appropriate operative site.  The knee was examined.  It was not hot or red.  It did go into recurvatum and there was some opening medially with the valgus stress.  She did receive a preoperative femoral nerve block per Anesthesia.  I marked the left knee as the appropriate site.  The patient was then  transported to room #7, placed under general endotracheal anesthesia without difficulty.  The nursing staff inserted a Foley catheter.  Urine was clear.  Left thigh tourniquet was then applied.  The leg was prepped with chlorhexidine scrub and then DuraPrep from the tourniquet to the tips of the toes.  Sterile draping was performed.  With the extremity elevated, it was Esmarch exsanguinated with a proximal tourniquet at 350 mmHg.  We did call a time-out.  After Esmarch exsanguination and elevation of tourniquet, a midline longitudinal incision was made about the left knee by using the old incision.  Via sharp dissection, incision was carried down to subcutaneous tissue.  First layer of capsule was incised in the midline. I did not encounter any nonabsorbable suture.  A medial parapatellar incision was then made through the deep capsule with the Bovie.  The joint was entered.  There was a clear yellow joint effusion, this was sent for anaerobic and aerobic cultures.  The patella was everted to 180 degrees.  There was partial avulsion of the patellar tendon, but I was able to flex the knee 90 degrees and then with good visualization. There was moderate amount of synovitis that did not appear to be infectious.  I did  send deep synovial specimens to pathology for white cells per high-powered field and there was no evidence of infection with fewer than 5 white cells per high-powered field.  I then proceeded with the removal of the components and revision total knee replacement.  I used a very small oscillating saw to remove the femoral component.  It did appear to be loose, there was a very minimal glue mantle, I was able to remove it with a little if any bone loss.  Further synovectomy was performed.  There was no evidence of any purulence.  The fixed polyethylene bearing was then easily removed, and then I was able to remove the tibial component in a similar fashion to that of the femur  by using a small oscillating saw,  and then using sequential osteotomes.  It was removed without difficulty and again there was very minimal of glue mantle that did not appear to be loose.  The wound was then irrigated.  Any loose methacrylate was debrided and I did a complete synovectomy.  MCL and LCL remained intact.  I then performed a revision knee replacement using the above-mentioned DePuy Sigma components.  First, bony cut was then made transversely on the proximal tibia with a 2-degree angle of declination.  There was nice bone, and there did not appear to be any bone loss.  I then measured a #2 tibial tray.  The center hole was then made, followed by the keeled cut.  I hand reamed to 13 mm and using the intramedullary reamer as a guide, I made the above- mentioned bony cuts.  I then inserted the trial tibial tray with a 13 x 30 mm stem and had a very nice fit with bone apposition circumferentially and it covered the proximal tibia nicely.  I then proceed to perform the revision of the femoral component.  I hand reamed to 14 mm and then using the intramedullary guide obtained the anterior distal and posterior bony cuts.  We measured initially an 8-mm distal augment medially and a 4-mm augment laterally, but after constructing the trial component and inserting it, we felt like the component was elevated off the femurs.  Then we used 2 posterior 8-mm augments and the 4-mm distal augment laterally, and that perfectly seated the femoral component anterior to posterior and medial to lateral.  With that component in place, we then trialed several polyethylene bearings and felt like the 20 mm gave [place] perfect stability. The patient had recurvatum  preoperatively and did not have hyperextension post insertion of the components, there was no opening with varus or valgus stress.  At that point, I removed the polyethylene button.  There was little if any bone loss.  I cleared all the  remaining methacrylate and then reamed and inserted a new trial patella and reduced it and put through a full range of motion without instability.  All the trial components were then removed.  I deflated the tourniquet at an hour and 4 minutes.  Gross bleeders were Bovie coagulated.  I irrigated the wound copiously with approximately 3000 mL of saline solution and impacted.  We then constructed the femoral and tibial components on the table.  As mentioned above, I used a size 2 MBT revision tibial tray with a 13 mm x 30 mm stem and prior to insertion I inserted a size 3 cement restrictor.  Using the __________ I injected the SmartSet GHV Gentamicin Bone Cement into the tibial hole then seated the tibial tray in excellent position.  I then irrigated the femur again and inserted the constructed femoral component with a size 2.5 TC3 femoral component, the above mentioned all augments and a 75 mm x 14 mm universal fluted stem.  This was also cemented in place.  The 14-mm corresponded to the hand reamed 14-mm femoral canal.  The trial 20-mm polyethylene bearing was then inserted and the entire construct was reduced and with compression we allowed the cement to mature while also inserting the patellar button with a patellar clamp.  At approximately 17 minutes of methacrylate had matured, we inspected the joint to see any further extraneous methacrylate.  The trial tibial rotating polyethylene bearing was then removed and we reinserted the final 20-mm bearing without difficulty, removed any cement from behind the femur or the tibia.  Again, the wound was irrigated.  After insertion of the final polyethylene bearing, the knee was placed through full range of motion.  There was no opening with the varus or valgus stress.  We had full extension and flexion without any malrotation and the patella tracked perfectly.  Wound was again irrigated with saline solution.  I used a single PEEK Biomet 5.5  mm anchor into the tibial tubercle to secure the partially avulsed tibial patellar tendon, inserted a medium-size Hemovac.  I closed the deep capsule with interrupted #1 Ethibond, the superficial capsule closed with a running 0 Vicryl, subcu closed with 2-0 Vicryl and 3-0 Monocryl.  Skin closed with skin clips.  Sterile bulky dressing was applied followed by the patient's support stocking.  The patient tolerated the procedure without complications and returned to the post anesthesia recovery room in satisfactory condition.     Claude Manges. [name], M.D.     PWW/MEDQ  D:  05/15/2013  T:  05/16/2013  Job:  [id number]

## 2013-05-16 NOTE — Progress Notes (Signed)
UR review completed. 

## 2013-05-16 NOTE — Progress Notes (Signed)
PT Cancellation Note  Patient Details Name: Leah Duncan MRN: [id number] DOB: 28-Aug-1955   Cancelled Treatment:    Reason Eval/Treat Not Completed: Patient declined, no reason specified  Will return later today for PT eval   Van Clines [facility] 05/16/2013, 12:04 PM

## 2013-05-16 NOTE — Evaluation (Signed)
Occupational Therapy Evaluation Patient Details Name: Leah Duncan MRN: [id number] DOB: May 09, 1956 Today's Date: 05/16/2013 Time: 4098-1191 OT Time Calculation (min): 29 min  OT Assessment / Plan / Recommendation History of present illness PAINFUL, failed left total knee now s/p Left Total knee Revision.   Clinical Impression   Pt is 57 y/o female presenting s/p L TKR. She demonstrates impairments in her ability to perform ADL's and functional transfers and should benefit from acute OT to assist in maximizing independence in these areas prior to anticipated d/c home w/ PRN family/mother assist and HHOT.    OT Assessment  Patient needs continued OT Services    Follow Up Recommendations  Home health OT    Barriers to Discharge      Equipment Recommendations  None recommended by OT (Pt has DME)    Recommendations for Other Services    Frequency  Min 2X/week    Precautions / Restrictions Precautions Precautions: Fall;Knee Restrictions Weight Bearing Restrictions: Yes LLE Weight Bearing: Partial weight bearing LLE Partial Weight Bearing Percentage or Pounds: 50%   Pertinent Vitals/Pain 6/10 L knee pain. RN aware and gave medication prior to treatment session. Repositioned, rest.   ADL  Grooming: Performed;Wash/dry hands;Wash/dry face;Modified independent Where Assessed - Grooming: Supported sitting;Unsupported sitting Upper Body Bathing: Simulated;Set up Where Assessed - Upper Body Bathing: Supported sitting Lower Body Bathing: Simulated;Moderate assistance Where Assessed - Lower Body Bathing: Supported sit to stand Upper Body Dressing: Performed;Set up Where Assessed - Upper Body Dressing: Unsupported sitting Lower Body Dressing: Performed;Moderate assistance Where Assessed - Lower Body Dressing: Supported sit to Pharmacist, hospital: Simulated;Moderate assistance;Maximal assistance (Transfers from EOB to chair) Toilet Transfer Method: Sit to stand;Stand pivot Toilet  Transfer Equipment: Bedside commode Toileting - Clothing Manipulation and Hygiene: Simulated;Moderate assistance Where Assessed - Toileting Clothing Manipulation and Hygiene: Standing Tub/Shower Transfer Method: Not assessed Equipment Used: Gait belt;Rolling walker Transfers/Ambulation Related to ADLs: Pt overall Mod assist for bed mobility and mos-max assist SPT from EOB to chair. Pt lethargic and c/o "sleepy" after pain medications and demonstrated difficulty following commands for hand placement and RW use during transfer noted. ADL Comments: Pt was educated in role of OT and discussed LB dressing/bathing techniques and a/e. Pt performed grooming sitting EOB then became naseous and vommitted x1, RN made aware of this ? medication related.     OT Diagnosis: Generalized weakness;Acute pain  OT Problem List: Decreased activity tolerance;Impaired balance (sitting and/or standing);Decreased knowledge of precautions;Decreased knowledge of use of DME or AE;Decreased safety awareness;Decreased strength;Pain OT Treatment Interventions: Self-care/ADL training;DME and/or AE instruction;Patient/family education;Therapeutic activities;Balance training   OT Goals(Current goals can be found in the care plan section) Acute Rehab OT Goals Patient Stated Goal: Home w/ therapy and PRN assist from mother/family OT Goal Formulation: With patient Time For Goal Achievement: 05/30/13 Potential to Achieve Goals: Good  Visit Information  Last OT Received On: 05/16/13 Assistance Needed: +1 History of Present Illness: PAINFUL, failed left total knee now s/p Left Total knee Revision.       Prior Functioning     Home Living Family/patient expects to be discharged to:: Private residence Living Arrangements: Parent Available Help at Discharge: Family;Available 24 hours/day Type of Home: House Home Access: Stairs to enter [employer] of Steps: 1 Home Layout: One level Home Equipment: Walker - 2  wheels;Bedside commode;Shower seat;Adaptive equipment Adaptive Equipment: Reacher;Sock aid;Long-handled shoe horn;Long-handled sponge Prior Function Level of Independence: Independent Communication Communication: No difficulties Dominant Hand: Right    Vision/Perception Vision - History  Baseline Vision: Wears glasses only for reading Patient Visual Report: No change from baseline   Cognition  Cognition Arousal/Alertness: Awake/alert;Lethargic Behavior During Therapy: WFL for tasks assessed/performed Overall Cognitive Status: Within Functional Limits for tasks assessed    Extremity/Trunk Assessment Upper Extremity Assessment Upper Extremity Assessment: Overall WFL for tasks assessed Lower Extremity Assessment Lower Extremity Assessment: Defer to PT evaluation    Mobility Bed Mobility Bed Mobility: Supine to Sit;Sitting - Scoot to Edge of Bed Supine to Sit: 3: Mod assist;HOB flat;With rails Sitting - Scoot to Edge of Bed: 3: Mod assist;With rail Details for Bed Mobility Assistance: Assist for LE and safety/sequencing of all mobility and transfers Transfers Transfers: Sit to Stand;Stand to Sit Sit to Stand: 3: Mod assist;From bed;With upper extremity assist;With armrests Stand to Sit: 3: Mod assist;2: Max assist;To chair/3-in-1;With upper extremity assist Details for Transfer Assistance: VC's for safety, sequencing and hand placement w/ RW. Pt reaching to chair w/ one hand on RW x4 despite maximal verbal and tactile cues for hand placement. Pt currently not safe w/ SPT, this could be medication related, cont to assess.        Balance Balance Balance Assessed: Yes Static Sitting Balance Static Sitting - Balance Support: Bilateral upper extremity supported;Feet supported Static Standing Balance Static Standing - Balance Support: Bilateral upper extremity supported   End of Session OT - End of Session Equipment Utilized During Treatment: Gait belt;Rolling walker Activity  Tolerance: Patient limited by fatigue;Patient limited by pain Patient left: in chair;with call bell/phone within reach Nurse Communication: Mobility status;Precautions;Weight bearing status CPM Left Knee CPM Left Knee: Off Left Knee Flexion (Degrees): 40 Left Knee Extension (Degrees): 0  GO     [place], Amy Beth Dixon 05/16/2013, 10:18 AM

## 2013-05-16 NOTE — Progress Notes (Addendum)
Patient wants to go home with home health. Clinical Social Worker will sign off for now as social work intervention is no longer needed. Please consult [place] again if new need arises.   Sabino Niemann, MSW, [employer] [phone]

## 2013-05-16 NOTE — Progress Notes (Signed)
Patient ID: Leah Duncan, female   DOB: Feb 22, 1956, 57 y.o.   MRN: [id number] PATIENT ID: Leah Duncan        MRN:  [id number]          DOB/AGE: [birth year] / 56 y.o.    Leah Campbell, MD   [name], PA-C [address] [place], [place]  [address]                             [phone]   PROGRESS NOTE  Subjective:  negative for Chest Pain  negative for Shortness of Breath  negative for Nausea/Vomiting   negative for Calf Pain    Tolerating Diet: yes         Patient reports pain as moderate.     Pain started about 7 last night-comfortable with pain meds, awake alert  Objective: Vital signs in last 24 hours:   Patient Vitals for the past 24 hrs:  BP Temp Temp src Pulse Resp SpO2  05/16/13 0425 93/55 mmHg 97.9 F (36.6 C) - 81 17 100 %  05/16/13 0400 - - - - 16 -  05/15/13 2355 105/60 mmHg 97.8 F (36.6 C) - 80 17 100 %  05/15/13 2333 - - - - 16 -  05/15/13 2009 118/76 mmHg 97.9 F (36.6 C) - 70 14 100 %  05/15/13 1945 118/58 mmHg - - 78 17 100 %  05/15/13 1930 117/98 mmHg 98.3 F (36.8 C) - 93 12 86 %  05/15/13 1915 117/71 mmHg - - 89 16 100 %  05/15/13 1900 - - - 92 14 100 %  05/15/13 1845 148/63 mmHg - - 80 10 99 %  05/15/13 1830 132/77 mmHg - - 84 15 100 %  05/15/13 1815 149/94 mmHg - - 82 15 100 %  05/15/13 1800 147/72 mmHg - - 81 13 100 %  05/15/13 1745 113/85 mmHg - - 83 11 100 %  05/15/13 1730 122/73 mmHg 98.1 F (36.7 C) - 81 14 100 %  05/15/13 1230 154/66 mmHg - - 81 20 100 %  05/15/13 1220 - - - 69 6 100 %  05/15/13 1120 143/70 mmHg 97 F (36.1 C) Oral 65 18 99 %      Intake/Output from previous day:   12/02 0701 - 12/03 0700 In: 1300 [I.V.:1300] Out: 305 [Urine:205]   Intake/Output this shift:       Intake/Output     12/02 0701 - 12/03 0700 12/03 0701 - 12/04 0700   I.V. 1300    Total Intake 1300     Urine 205    Blood 100    Total Output 305     Net +995             LABORATORY DATA:  Recent Labs  05/16/13 0440  WBC 8.6   HGB 9.0*  HCT 26.8*  PLT 173    Recent Labs  05/16/13 0440  NA 138  K 4.8  CL 105  CO2 27  BUN 14  CREATININE 0.76  GLUCOSE 113*  CALCIUM 8.9   Lab Results  Component Value Date   INR 0.90 05/03/2013    Recent Radiographic Studies :  Chest 2 View  05/03/2013   CLINICAL DATA:  Total knee revision left side  EXAM: CHEST  2 VIEW  COMPARISON:  09/12/2012  FINDINGS: Mild cardiac enlargement. Vascular pattern normal. Lungs clear except for minimal scarring or atelectasis in the lingula.  IMPRESSION: No significant acute abnormalities.   Electronically Signed   By: Esperanza Heir M.D.   On: 05/03/2013 13:11     Examination:  General appearance: alert, cooperative and no distress  Wound Exam: clean, dry, intact   Drainage:  None: wound tissue dry  Motor Exam: EHL, FHL, Anterior Tibial and Posterior Tibial Intact  Sensory Exam: Superficial Peroneal, Deep Peroneal and Tibial normal  Vascular Exam: Normal  Assessment:    1 Day Post-Op  Procedure(s) (LRB): TOTAL KNEE REVISION (Left)  ADDITIONAL DIAGNOSIS:  Principal Problem:   Painful total knee replacement Left knee Active Problems:   Failed total left knee replacement   S/P revision of total knee  Acute Blood Loss Anemia-will monitor   Plan: Physical Therapy as ordered Partial Weight Bearing @ 50% (PWB)  DVT Prophylaxis:  Xarelto  DISCHARGE PLAN: Home  DISCHARGE NEEDS: HHPT, CPM, Walker and 3-in-1 comode seat   foley out,hemovac still draining over 50cc's per shift-will keep in place until am,OOB with PT      [facility], [name] 05/16/2013 7:51 AM

## 2013-05-17 LAB — CBC
HCT: 24.3 % — ABNORMAL LOW (ref 36.0–46.0)
Hemoglobin: 8.3 g/dL — ABNORMAL LOW (ref 12.0–15.0)
MCH: 30.3 pg (ref 26.0–34.0)
MCHC: 34.2 g/dL (ref 30.0–36.0)
MCV: 88.7 fL (ref 78.0–100.0)
Platelets: 149 10*3/uL — ABNORMAL LOW (ref 150–400)
RDW: 14.2 % (ref 11.5–15.5)
WBC: 7.6 10*3/uL (ref 4.0–10.5)

## 2013-05-17 LAB — BASIC METABOLIC PANEL
BUN: 13 mg/dL (ref 6–23)
Chloride: 104 mEq/L (ref 96–112)
Creatinine, Ser: 0.9 mg/dL (ref 0.50–1.10)
GFR calc non Af Amer: 70 mL/min — ABNORMAL LOW (ref >90)
Glucose, Bld: 151 mg/dL — ABNORMAL HIGH (ref 70–99)
Potassium: 3.5 mEq/L (ref 3.5–5.1)

## 2013-05-17 LAB — PREPARE RBC (CROSSMATCH)

## 2013-05-17 MED ORDER — OXYCODONE HCL 5 MG PO TABS: 5.0000 mg | ORAL_TABLET | ORAL | Status: DC | PRN

## 2013-05-17 MED ORDER — RIVAROXABAN 10 MG PO TABS: 10.0000 mg | ORAL_TABLET | Freq: Every day | ORAL | Status: DC

## 2013-05-17 MED ORDER — METHOCARBAMOL 500 MG PO TABS: 500.0000 mg | ORAL_TABLET | Freq: Three times a day (TID) | ORAL | Status: DC | PRN

## 2013-05-17 NOTE — Progress Notes (Signed)
Physical Therapy Treatment Patient Details Name: LANEE CHAIN MRN: [id number] DOB: 09/20/1955 Today's Date: 05/17/2013 Time: 8295-6213 PT Time Calculation (min): 30 min  PT Assessment / Plan / Recommendation  History of Present Illness Painful, failed left total knee now s/p Left Total knee Revision.   PT Comments   POD # 2 pm session.  Assisted pt OOB to amb to BR then back to bed.  Attempted to perform TE's however unable to complete all due to MAX c/o nausea.  General overall feeling of MAX weakness/fatigue with limited activity tolerance. Reapplied CPM and ICE.   Follow Up Recommendations  Home health PT;Supervision/Assistance - 24 hour     Does the patient have the potential to tolerate intense rehabilitation     Barriers to Discharge        Equipment Recommendations  Rolling walker with 5" wheels;3in1 (PT)    Recommendations for Other Services    Frequency 7X/week   Progress towards PT Goals Progress towards PT goals: Progressing toward goals  Plan      Precautions / Restrictions Precautions Precautions: Fall;Knee Restrictions Weight Bearing Restrictions: Yes LLE Weight Bearing: Partial weight bearing LLE Partial Weight Bearing Percentage or Pounds: 50%    Pertinent Vitals/Pain C/o 5/10 knee pain ICE applied meds requested    Mobility  Bed Mobility Bed Mobility: Supine to Sit;Sitting - Scoot to Edge of Bed;Sit to Supine Supine to Sit: 4: Min assist;4: Min guard Sitting - Scoot to [place] of Bed: 4: Min assist;4: Min guard Sit to Supine: 3: Mod assist Details for Bed Mobility Assistance: Assist to support L LE and increased time Transfers Transfers: Sit to Stand;Stand to Sit Sit to Stand: From toilet;4: Min guard;4: Min assist;From bed Stand to Sit: To chair/3-in-1;4: Min guard;4: Min assist;To toilet;To bed Details for Transfer Assistance: 50% VC's for safety with turn completion prior to sit and support for L LE due to pain plus increased  time Ambulation/Gait Ambulation/Gait Assistance: 4: Min assist;3: Mod assist Ambulation Distance (Feet): 22 Feet (11' x 2 to and from BR only) Assistive device: Rolling walker Ambulation/Gait Assistance Details: 50% VC's on proper sequencing to maintain 50% WB and increased time.  Pt less drowsy this afternoon.  Still unsteady/sluggish gait. Gait Pattern: Step-to pattern;Decreased stance time - left;Trunk flexed Gait velocity: decreased    Exercises  10 reps ankle pumps B LE 10 reps knee presses B LE 5 reps heels slides AAROM L LE TE's stopped due to c/o nausea    PT Goals (current goals can now be found in the care plan section)    Visit Information  Last PT Received On: 05/17/13 Assistance Needed: +1 History of Present Illness: Painful, failed left total knee now s/p Left Total knee Revision.    Subjective Data      Cognition       Balance     End of Session PT - End of Session Equipment Utilized During Treatment: Gait belt Activity Tolerance: Patient limited by fatigue Patient left: in bed;in CPM   [name]  PTA [facility]  Acute  Rehab Pager      [phone]

## 2013-05-17 NOTE — Care Management Note (Signed)
Patient preoperatively setup with [facility], no changes. CPM provided by [employer]. Vance Peper, RN BSN

## 2013-05-17 NOTE — Discharge Summary (Signed)
Leah Campbell, MD   Leah Code, PA-C [address], [place], [place]  [address]                             [phone]  PATIENT ID: ELLIANNA Duncan        MRN:  [id number]          DOB/AGE: 1955/08/07 / 57 y.o.    DISCHARGE SUMMARY  ADMISSION DATE:    05/15/2013 DISCHARGE DATE:   05/18/2013   ADMISSION DIAGNOSIS: FAILED LT TKA WITH LOOSENED FEMORAL COMPONENT    DISCHARGE DIAGNOSIS:  FAILED LT TKA WITH LOOSENED FEMORAL COMPONENT    ADDITIONAL DIAGNOSIS: Principal Problem:   Painful total knee replacement Left knee Active Problems:   Failed total left knee replacement   S/P revision of total knee   Acute blood loss anemia  Past Medical History  Diagnosis Date  . Hypertension   . Depression   . COPD (chronic obstructive pulmonary disease)   . Shortness of breath     anxiety, exertion  . GERD (gastroesophageal reflux disease)   . Headache(784.0)   . Arthritis   . Kidney stones 02/2013    PROCEDURE: Procedure(s): TOTAL KNEE REVISION Left on 05/15/2013  CONSULTS: none     HISTORY: Leah Duncan is a very pleasant 57 year old white female who is seen today for evaluation of her left knee. She apparently has had a left total knee replacement by Dr. Herbert Moors in 2011. She states that she has never been satisfied with the results of this. She has had persistent swelling and aching pain which worsens at the end of the day. 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 She is on her feet a lot and she actually started to notice now that she is having problems where she is tripping and falling. The pain has not gotten to the point where she is having pain and discomfort sometimes with every step. She has been seen by Dr. Turner Daniels, Dr. Charlett Blake, and Dr. Ophelia Charter. She also has been seen by Dr. [name] and he has obtained multiple tests in regard to her knee. She has C-reactive protein and a sed rate which were considered both normal. She has a normal white count in her blood. She has a small  amount of bacteria in urine. A 3 phase bone scan revealed hyperemia in the left knee which was not present on a prior study in 2013 was centered on the distal femur. Blood pool uptake was primarily along the distal femur adjacent to the femoral component. This was similar to a prior exam, but the late activity showed more uniform uptake in the distal femur and proximal tibia and patella. It is more intense than it was on the right knee. There is a suspicion of whether there was loosening in the femoral component. She has been continuing to have significant pain and discomfort in the knee to the point where it is impeding upon her ability to earn a living. She is having difficulty with activity of daily living. Continues to have pain which she grades as moderately severe, more of an aching throbbing with occasional stabbing pain. She denies any neurovascular compromise.   HOSPITAL COURSE:  Leah Duncan is a 57 y.o. admitted on 05/15/2013 and found to have a diagnosis of FAILED LT TKA WITH LOOSENED FEMORAL COMPONENT.  After appropriate laboratory studies were obtained  they were taken to the operating room on 05/15/2013 and underwent  Procedure(s): TOTAL KNEE REVISION  Left.   They were given perioperative antibiotics:  Anti-infectives   Start     Dose/Rate Route Frequency Ordered Stop   05/16/13 0400  vancomycin (VANCOCIN) IVPB 1000 mg/200 mL premix     1,000 mg 200 mL/hr over 60 Minutes Intravenous Every 12 hours 05/15/13 2031 05/17/13 0442   05/15/13 2200  vancomycin (VANCOCIN) IVPB 1000 mg/200 mL premix  Status:  Discontinued     1,000 mg 200 mL/hr over 60 Minutes Intravenous Every 12 hours 05/15/13 2016 05/15/13 2040   05/15/13 1430  vancomycin (VANCOCIN) 1,000 mg in sodium chloride 0.9 % 250 mL IVPB  Status:  Discontinued     1,000 mg 250 mL/hr over 60 Minutes Intravenous Every 12 hours 05/15/13 1421 05/15/13 2015   05/15/13 1334  vancomycin (VANCOCIN) 1 GM/200ML IVPB  Status:  Discontinued      Comments:  Hypes, Karen   : cabinet override      05/15/13 1334 05/15/13 2016    .  Tolerated the procedure well.  Placed with a foley intraoperatively.    Toradol was given post op.  POD #1, allowed out of bed to a chair.  PT for ambulation and exercise program.  Foley D/C'd in morning.  IV saline locked.  O2 discontionued.  POD #2, continued PT and ambulation.  Hemovac pulled.  Having difficult time with performing PT.  Extremely tired and BP systolic 40-50 points lower from preop. No appetite. Gave one unit of blood. Added reglan for hypoactive bowel sounds. Given one unit PRBC's.  POD #3, continued PT and ambulation. BS markedly improved.  Ate regular diet for breakfast.  Pain controlled. Wanted to go home.  The remainder of the hospital course was dedicated to ambulation and strengthening.   The patient was discharged on 3 Days Post-Op in  Stable condition.  Blood products given:one unit PRBC  DIAGNOSTIC STUDIES: Recent vital signs:  Patient Vitals for the past 24 hrs:  BP Temp Temp src Pulse Resp SpO2  05/18/13 0616 111/63 mmHg 99 F (37.2 C) Oral 103 16 97 %  05/18/13 0400 - - - - 17 98 %  05/17/13 2320 104/57 mmHg 99.7 F (37.6 C) Oral 110 17 99 %  05/17/13 2302 - - - - 16 95 %  05/17/13 2220 105/58 mmHg 98.4 F (36.9 C) Oral 114 17 96 %  05/17/13 2155 104/65 mmHg 97.8 F (36.6 C) - 104 18 99 %  05/17/13 2130 102/62 mmHg 99 F (37.2 C) Oral 114 16 94 %  05/17/13 2120 109/58 mmHg 99 F (37.2 C) Oral 116 16 95 %  05/17/13 2105 104/53 mmHg 98.3 F (36.8 C) - 120 17 95 %  05/17/13 2038 104/56 mmHg 98.3 F (36.8 C) - 118 18 94 %  05/17/13 2000 - - - - 16 -  05/17/13 1304 100/58 mmHg 98.4 F (36.9 C) - 80 20 94 %       Recent laboratory studies:  Recent Labs  05/16/13 0440 05/16/13 1350 05/17/13 0501 05/18/13 0535  WBC 8.6 8.5 7.6 8.4  HGB 9.0* 8.1* 8.3* 9.4*  HCT 26.8* 24.5* 24.3* 27.2*  PLT 173 176 149* 132*    Recent Labs  05/16/13 0440  05/17/13 0501 05/18/13 0535  NA 138 135 136  K 4.8 3.5 3.9  CL 105 104 104  CO2 27 21 25   BUN 14 13 9   CREATININE 0.76 0.90 0.79  GLUCOSE 113* 151* 173*  CALCIUM 8.9 9.0 9.2  Lab Results  Component Value Date   INR 0.90 05/03/2013     Recent Radiographic Studies :  Chest 2 View  05/03/2013   CLINICAL DATA:  Total knee revision left side  EXAM: CHEST  2 VIEW  COMPARISON:  09/12/2012  FINDINGS: Mild cardiac enlargement. Vascular pattern normal. Lungs clear except for minimal scarring or atelectasis in the lingula.  IMPRESSION: No significant acute abnormalities.   Electronically Signed   By: Esperanza Heir M.D.   On: 05/03/2013 13:11    DISCHARGE INSTRUCTIONS:     Discharge Orders   Future Orders Complete By Expires   Call MD / Call 911  As directed    Comments:     If you experience chest pain or shortness of breath, CALL 911 and be transported to the hospital emergency room.  If you develope a fever above 101 F, pus (white drainage) or increased drainage or redness at the wound, or calf pain, call your surgeon's office.   Change dressing  As directed    Comments:     Change dressing on SUNDAY, then change the dressing daily with sterile 4 x 4 inch gauze dressing and apply TED hose.  You may clean the incision with alcohol prior to redressing.   Constipation Prevention  As directed    Comments:     Drink plenty of fluids.  Prune juice and/or coffee may be helpful.  You may use a stool softener, such as Colace (over the counter) 100 mg twice a day.  Use MiraLax (over the counter) for constipation as needed but this may take several days to work.  Mag Citrate --OR-- Milk of Magnesia --OR -- Dulcolax pills/suppositories may also be used but follow directions on the label.   CPM  As directed    Comments:     Continuous passive motion machine (CPM):      Use the CPM from 0 to 60 for 6-8 hours per day.      You may increase by 5-10 per day.  You may break it up into 2 or 3 sessions  per day.      Use CPM for 3-4 weeks or until you are told to stop.   Diet general  As directed    Discharge instructions  As directed    Comments:     YOU WERE GIVEN A DEVICE CALLED AN INCENTIVE SPIROMETER TO HELP YOU TAKE DEEP BREATHS.  PLEASE USE THIS AT LEAST TEN (10) TIMES EVERY 1-2 HOURS EVERY DAY TO PREVENT PNEUMONIA.   Do not put a pillow under the knee. Place it under the heel.  As directed    Driving restrictions  As directed    Comments:     No driving for 6 weeks   Increase activity slowly as tolerated  As directed    Lifting restrictions  As directed    Comments:     No lifting for 6 weeks   Partial weight bearing  As directed    Comments:     50  % WEIGHT BEARING AS TAUGHT IN PHYSICAL THERAPY   Questions:     % Body Weight:     Laterality:     Extremity:     Patient may shower  As directed    Comments:     You may shower over the [place] dressing.  Once the dressing is removed you may shower without a dressing once there is no drainage.  Do not wash over the wound.  If drainage  remains, cover wound with plastic wrap and then shower.   TED hose  As directed    Comments:     Use stockings (TED hose) for 2 weeks on operative leg(s).  You may remove them at night for sleeping.      DISCHARGE MEDICATIONS:     Medication List    STOP taking these medications       OVER THE COUNTER MEDICATION     traMADol 50 MG tablet  Commonly known as:  ULTRAM      TAKE these medications       acetaminophen 325 MG tablet  Commonly known as:  TYLENOL  Take 650 mg by mouth every 6 (six) hours as needed.     amitriptyline 10 MG tablet  Commonly known as:  ELAVIL  Take 10 mg by mouth at bedtime.     lisinopril 10 MG tablet  Commonly known as:  PRINIVIL,ZESTRIL  Take 10 mg by mouth daily.     magnesium oxide 400 MG tablet  Commonly known as:  MAG-OX  Take 400 mg by mouth 2 (two) times daily.     methocarbamol 500 MG tablet  Commonly known as:  ROBAXIN  Take 1 tablet (500  mg total) by mouth every 8 (eight) hours as needed for muscle spasms.     oxyCODONE 5 MG immediate release tablet  Commonly known as:  Oxy IR/ROXICODONE  Take 1-2 tablets (5-10 mg total) by mouth every 4 (four) hours as needed for moderate pain, severe pain or breakthrough pain.     pantoprazole 40 MG tablet  Commonly known as:  PROTONIX  Take 40 mg by mouth 2 (two) times daily.     rivaroxaban 10 MG Tabs tablet  Commonly known as:  XARELTO  Take 1 tablet (10 mg total) by mouth daily.        FOLLOW UP VISIT:   Follow-up Information   Follow up with [facility], Claude Manges, MD. Schedule an appointment as soon as possible for a visit on 05/30/2013.   Specialty:  Orthopedic Surgery   Contact information:   [address] [place] [address] [phone]       DISPOSITION:   Home  CONDITION:  Stable   PETRARCA,BRIAN 05/18/2013, 8:26 AM

## 2013-05-17 NOTE — Progress Notes (Signed)
Patient ID: Leah Duncan, female   DOB: 08-01-1955, 57 y.o.   MRN: [id number]   Patient Extremely tired and weak with BP systolic 40-50 points lower from preop.  Has not done well in PT as per therapist because of weakness.  BP is low despite holding BP meds  Will give one unit PRBC's tonight and see how she does in the morning.  May need 2nd prior to d/c home   Oris Drone. Aleda Grana [facility] [phone]  05/17/2013 6:09 PM

## 2013-05-17 NOTE — Progress Notes (Signed)
Patient ID: Leah Duncan, female   DOB: 07-30-1955, 57 y.o.   MRN: [id number] PATIENT ID: Leah Duncan        MRN:  [id number]          DOB/AGE: [birth year] / 57 y.o.    [name], MD   [name], PA-C [address] [place], [place]  [address]                             [phone]   PROGRESS NOTE  Subjective:  negative for Chest Pain  negative for Shortness of Breath  Some nausea, but tolerating diet, abdomen soft with active bowel sounds, passing gas  negative for Calf Pain    Tolerating Diet: yes         Patient reports pain as mild.     Progress in PT  Objective: Vital signs in last 24 hours:   Patient Vitals for the past 24 hrs:  BP Temp Temp src Pulse Resp SpO2  05/17/13 1304 100/58 mmHg 98.4 F (36.9 C) - 80 20 94 %  05/17/13 0512 95/51 mmHg 98.7 F (37.1 C) Oral 95 18 98 %  05/17/13 0351 - - - - 16 -  05/17/13 0152 98/45 mmHg 98.8 F (37.1 C) Oral 85 18 98 %  05/16/13 2342 - - - - 17 -  05/16/13 2100 94/42 mmHg 98.7 F (37.1 C) Oral 98 18 97 %  05/16/13 2000 - - - - 16 -      Intake/Output from previous day:   12/03 0701 - 12/04 0700 In: 2812.5 [P.O.:920; I.V.:1292.5] Out: 385 [Urine:200; Drains:185]   Intake/Output this shift:   12/04 0701 - 12/04 1900 In: 400 [P.O.:400] Out: -    Intake/Output     12/03 0701 - 12/04 0700 12/04 0701 - 12/05 0700   P.O. 920 400   I.V. 1292.5    IV Piggyback 600    Total Intake 2812.5 400   Urine 200    Drains 185    Blood     Total Output 385     Net +2427.5 +400        Urine Occurrence 3 x 2 x      LABORATORY DATA:  Recent Labs  05/16/13 0440 05/16/13 1350 05/17/13 0501  WBC 8.6 8.5 7.6  HGB 9.0* 8.1* 8.3*  HCT 26.8* 24.5* 24.3*  PLT 173 176 149*    Recent Labs  05/16/13 0440 05/17/13 0501  NA 138 135  K 4.8 3.5  CL 105 104  CO2 27 21  BUN 14 13  CREATININE 0.76 0.90  GLUCOSE 113* 151*  CALCIUM 8.9 9.0   Lab Results  Component Value Date   INR 0.90 05/03/2013     Recent Radiographic Studies :  Chest 2 View  05/03/2013   CLINICAL DATA:  Total knee revision left side  EXAM: CHEST  2 VIEW  COMPARISON:  09/12/2012  FINDINGS: Mild cardiac enlargement. Vascular pattern normal. Lungs clear except for minimal scarring or atelectasis in the lingula.  IMPRESSION: No significant acute abnormalities.   Electronically Signed   By: Esperanza Heir M.D.   On: 05/03/2013 13:11     Examination:  General appearance: alert, cooperative and no distress  Wound Exam: clean, dry, intact   Drainage:  Scant/small amount Serosanguinous exudate from hemovac-removed  Motor Exam: EHL, FHL, Anterior Tibial and Posterior Tibial Intact  Sensory Exam: Superficial Peroneal, Deep Peroneal and Tibial  normal  Vascular Exam: Normal  Assessment:    2 Days Post-Op  Procedure(s) (LRB): TOTAL KNEE REVISION (Left)  ADDITIONAL DIAGNOSIS:  Principal Problem:   Painful total knee replacement Left knee Active Problems:   Failed total left knee replacement   S/P revision of total knee  Acute Blood Loss Anemia-asymptomatic   Plan: Physical Therapy as ordered Partial Weight Bearing @ 50% (PWB)  DVT Prophylaxis:  Xarelto  DISCHARGE PLAN: Home  DISCHARGE NEEDS: HHPT, CPM, Walker and 3-in-1 comode seat   dressing changed, expect to D/C in am, H and H stable although low does not seem to be weak      Valeria Batman 05/17/2013 4:45 PM

## 2013-05-17 NOTE — Progress Notes (Signed)
Physical Therapy Treatment Patient Details Name: Leah Duncan MRN: [id number] DOB: 1955/09/15 Today's Date: 05/17/2013 Time: 1136-1206 PT Time Calculation (min): 30 min  PT Assessment / Plan / Recommendation  History of Present Illness Painful, failed left total knee now s/p Left Total knee Revision.   PT Comments   POD # 2 am session.  Pt progressing slowly.  MAX c/o fatigue (HgB 8.3).  Very drowsy/drunken gait.  Amb in hallway twice with recliner following behind.  HIGH FALL RISK as went to sit prior to completing turn.   Follow Up Recommendations  Home health PT;Supervision/Assistance - 24 hour     Does the patient have the potential to tolerate intense rehabilitation     Barriers to Discharge        Equipment Recommendations  Rolling walker with 5" wheels;3in1 (PT)    Recommendations for Other Services    Frequency 7X/week   Progress towards PT Goals Progress towards PT goals: Progressing toward goals  Plan      Precautions / Restrictions Precautions Precautions: Fall;Knee Restrictions Weight Bearing Restrictions: Yes LLE Weight Bearing: Partial weight bearing LLE Partial Weight Bearing Percentage or Pounds: 50%    Pertinent Vitals/Pain C/o 7/10 knee pain ICE applied    Mobility  Bed Mobility Bed Mobility: Not assessed Supine to Sit: 4: Min assist;HOB flat Sitting - Scoot to Edge of Bed: 4: Min guard Details for Bed Mobility Assistance: Pt OOB in recliner Transfers Transfers: Sit to Stand;Stand to Sit Sit to Stand: From chair/3-in-1;4: Min guard;4: Min assist Stand to Sit: To chair/3-in-1;4: Min guard;4: Min assist Details for Transfer Assistance: 50% VC's for safety with turn completion prior to sit and support for L LE due to pain Ambulation/Gait Ambulation/Gait Assistance: 4: Min assist;3: Mod assist Ambulation Distance (Feet): 50 Feet (25' x 2) Assistive device: Rolling walker Ambulation/Gait Assistance Details: 75% VC's on proper sequencing and  increased time as pt c/o MAX fatigue.  Instructed on PWB and proper walker to self distance.  Very unsteady, drunken gait.  Max encouragement to increase amb distance.  Gait Pattern: Step-to pattern;Decreased stance time - left;Trunk flexed Gait velocity: decreased    PT Goals (current goals can now be found in the care plan section) Acute Rehab PT Goals Patient Stated Goal: Home w/ therapy and PRN assist from family  Visit Information  Last PT Received On: 05/17/13 Assistance Needed: +1 History of Present Illness: Painful, failed left total knee now s/p Left Total knee Revision.    Subjective Data  Patient Stated Goal: Home w/ therapy and PRN assist from family   Cognition  Cognition Arousal/Alertness: Awake/alert;Lethargic Behavior During Therapy: WFL for tasks assessed/performed Overall Cognitive Status: Within Functional Limits for tasks assessed    Balance     End of Session PT - End of Session Equipment Utilized During Treatment: Gait belt Activity Tolerance: Patient limited by fatigue Patient left: in chair;with call bell/phone within reach;with family/visitor present   [name]  PTA [facility]  Acute  Rehab Pager      [phone]

## 2013-05-17 NOTE — Progress Notes (Signed)
Orthopedic Tech Progress Note Patient Details:  NEHEMIAH MONTEE 1955/12/10 [id number] On cpm at 3:15 pm LLE 0-50 Patient ID: Sharyne Peach, female   DOB: Oct 18, 1955, 57 y.o.   MRN: [id number]   Jennye Moccasin 05/17/2013, 3:17 PM

## 2013-05-17 NOTE — Progress Notes (Signed)
Occupational Therapy Treatment Patient Details Name: Leah Duncan MRN: [id number] DOB: December 04, 1955 Today's Date: 05/17/2013 Time: 4098-1191 OT Time Calculation (min): 28 min  OT Assessment / Plan / Recommendation  History of present illness Painful, failed left total knee now s/p Left Total knee Revision.   OT comments  Pt cont to be limited by fatigue/lethergy. Pt progressing towards POC/goals for acute OT given verbal encouragement to participate and increased time for all activity, she will need 24 supervision/assist and HHOT at d/c.  Follow Up Recommendations  Home health OT;Supervision/Assistance - 24 hour    Barriers to Discharge       Equipment Recommendations  None recommended by OT    Recommendations for Other Services    Frequency Min 2X/week   Progress towards OT Goals Progress towards OT goals: Progressing toward goals  Plan Discharge plan remains appropriate    Precautions / Restrictions Precautions Precautions: Fall;Knee Restrictions Weight Bearing Restrictions: Yes LLE Weight Bearing: Partial weight bearing LLE Partial Weight Bearing Percentage or Pounds: 50%   Pertinent Vitals/Pain Pt states "Ouch" but did not rate, Pt was given pain medication 1.5 hrs prior to OT arrival (attempted treatment session x3 before pt agreeable) "I'm too sleepy"    ADL  Eating/Feeding: Performed;Modified independent Where Assessed - Eating/Feeding: Chair Grooming: Performed;Wash/dry hands;Wash/dry face;Modified independent Where Assessed - Grooming: Supported standing Lower Body Dressing: Performed;Minimal assistance Where Assessed - Lower Body Dressing: Supported sit to Pharmacist, hospital: Performed;Minimal Dentist Method: Sit to Barista: Bedside commode (pt ambulated to 3:1 in bathroom) Toileting - Clothing Manipulation and Hygiene: Performed;Min guard Where Assessed - Glass blower/designer Manipulation and Hygiene: Sit to stand  from 3-in-1 or toilet;Standing Tub/Shower Transfer: Simulated;Minimal assistance (Simulated shower transfer w/ posterior approach backing up to & stepping into shower) Tub/Shower Transfer Method: Science writer: Other (comment) (Simulated by stepping up and over blanket roll on floor to simulate step.) Equipment Used: Gait belt;Rolling walker;Other (comment) (3:1) Transfers/Ambulation Related to ADLs: Min assist w/ increased time for all tasks and vc's/encouragement to participate. Pt often closes eyes, however does well when awake and moving/ambulating w/ RW.  ADL Comments: Pt and family were educated in shower transfers and pt's family states that they plan to use tub bench and tub from daughters house at d/c. Advised family that HHOT/PT should have pt practice before trying on their own. Pt/family agree verbally to sponge bathe until pt is more fully awake and moving better.  Reviewed a/e use w/ pt/family, however encouraged family to let pt try w/o a/e if/when possible to get maximal movement to knee.    OT Diagnosis:    OT Problem List:   OT Treatment Interventions:     OT Goals(current goals can now be found in the care plan section) Acute Rehab OT Goals Patient Stated Goal: Home w/ therapy and PRN assist from family OT Goal Formulation: With patient Time For Goal Achievement: 05/30/13 Potential to Achieve Goals: Good  Visit Information  Last OT Received On: 05/17/13 Assistance Needed: +1 History of Present Illness: Painful, failed left total knee now s/p Left Total knee Revision.    Subjective Data      Prior Functioning       Cognition  Cognition Arousal/Alertness: Awake/alert;Lethargic Behavior During Therapy: WFL for tasks assessed/performed Overall Cognitive Status: Within Functional Limits for tasks assessed    Mobility  Bed Mobility Bed Mobility: Supine to Sit;Sitting - Scoot to Edge of Bed Supine to Sit: 4: Min assist;HOB flat  Sitting -  Scoot to Edge of Bed: 4: Min guard Details for Bed Mobility Assistance: VC's and increased time for all tasks. Pt lethargic throughout session. Transfers Transfers: Sit to Stand;Stand to Sit Sit to Stand: From bed;With upper extremity assist;With armrests;4: Min assist;From chair/3-in-1 Stand to Sit: 4: Min guard;To chair/3-in-1;With armrests Details for Transfer Assistance: VC's for safety, sequencing and hand placement w/ RW.            End of Session OT - End of Session Equipment Utilized During Treatment: Gait belt;Rolling walker;Other (comment) (3:1 in bathroom) Activity Tolerance: Patient limited by fatigue Patient left: in chair;with call bell/phone within reach;with family/visitor present CPM Left Knee CPM Left Knee: Off  GO     Leah Duncan 05/17/2013, 11:22 AM

## 2013-05-18 ENCOUNTER — Encounter (HOSPITAL_COMMUNITY): Payer: Self-pay | Admitting: Orthopaedic Surgery

## 2013-05-18 DIAGNOSIS — D62 Acute posthemorrhagic anemia: Secondary | ICD-10-CM | POA: Diagnosis not present

## 2013-05-18 LAB — BASIC METABOLIC PANEL
BUN: 9 mg/dL (ref 6–23)
CO2: 25 mEq/L (ref 19–32)
Calcium: 9.2 mg/dL (ref 8.4–10.5)
Creatinine, Ser: 0.79 mg/dL (ref 0.50–1.10)
GFR calc non Af Amer: >90 mL/min (ref >90)
Glucose, Bld: 173 mg/dL — ABNORMAL HIGH (ref 70–99)
Potassium: 3.9 mEq/L (ref 3.5–5.1)

## 2013-05-18 LAB — TYPE AND SCREEN
ABO/RH(D): A POS
Antibody Screen: NEGATIVE
Unit division: 0

## 2013-05-18 LAB — CBC
HCT: 27.2 % — ABNORMAL LOW (ref 36.0–46.0)
Hemoglobin: 9.4 g/dL — ABNORMAL LOW (ref 12.0–15.0)
MCH: 30.4 pg (ref 26.0–34.0)
MCHC: 34.6 g/dL (ref 30.0–36.0)
MCV: 88 fL (ref 78.0–100.0)
RDW: 13.9 % (ref 11.5–15.5)
WBC: 8.4 10*3/uL (ref 4.0–10.5)

## 2013-05-18 NOTE — Progress Notes (Signed)
Patient ID: Leah Duncan, female   DOB: Jun 06, 1956, 57 y.o.   MRN: [id number] PATIENT ID: Leah Duncan        MRN:  [id number]          DOB/AGE: Oct 05, 1955 / 57 y.o.    Leah Campbell, MD   Leah Code, PA-C [address] [place], [place]  [address]                             [phone]   PROGRESS NOTE  Subjective:  negative for Chest Pain  negative for Shortness of Breath  negative for Nausea/Vomiting   negative for Calf Pain    Tolerating Diet: yes         Patient reports pain as mild and moderate.     Much better this morning.  Ate her breakfast regular diet.  Wants to go home  Objective: Vital signs in last 24 hours:   Patient Vitals for the past 24 hrs:  BP Temp Temp src Pulse Resp SpO2  05/18/13 0616 111/63 mmHg 99 F (37.2 C) Oral 103 16 97 %  05/18/13 0400 - - - - 17 98 %  05/17/13 2320 104/57 mmHg 99.7 F (37.6 C) Oral 110 17 99 %  05/17/13 2302 - - - - 16 95 %  05/17/13 2220 105/58 mmHg 98.4 F (36.9 C) Oral 114 17 96 %  05/17/13 2155 104/65 mmHg 97.8 F (36.6 C) - 104 18 99 %  05/17/13 2130 102/62 mmHg 99 F (37.2 C) Oral 114 16 94 %  05/17/13 2120 109/58 mmHg 99 F (37.2 C) Oral 116 16 95 %  05/17/13 2105 104/53 mmHg 98.3 F (36.8 C) - 120 17 95 %  05/17/13 2038 104/56 mmHg 98.3 F (36.8 C) - 118 18 94 %  05/17/13 2000 - - - - 16 -  05/17/13 1304 100/58 mmHg 98.4 F (36.9 C) - 80 20 94 %      Intake/Output from previous day:   12/04 0701 - 12/05 0700 In: 865 [P.O.:520; I.V.:20] Out: -    Intake/Output this shift:       Intake/Output     12/04 0701 - 12/05 0700 12/05 0701 - 12/06 0700   P.O. 520    I.V. 20    Blood 325    IV Piggyback     Total Intake 865     Urine     Drains     Total Output       Net +865          Urine Occurrence 3 x       LABORATORY DATA:  Recent Labs  05/16/13 0440 05/16/13 1350 05/17/13 0501 05/18/13 0535  WBC 8.6 8.5 7.6 8.4  HGB 9.0* 8.1* 8.3* 9.4*  HCT 26.8* 24.5* 24.3* 27.2*  PLT 173  176 149* 132*    Recent Labs  05/16/13 0440 05/17/13 0501 05/18/13 0535  NA 138 135 136  K 4.8 3.5 3.9  CL 105 104 104  CO2 27 21 25   BUN 14 13 9   CREATININE 0.76 0.90 0.79  GLUCOSE 113* 151* 173*  CALCIUM 8.9 9.0 9.2   Lab Results  Component Value Date   INR 0.90 05/03/2013    Recent Radiographic Studies :  Chest 2 View  05/03/2013   CLINICAL DATA:  Total knee revision left side  EXAM: CHEST  2 VIEW  COMPARISON:  09/12/2012  FINDINGS: Mild cardiac enlargement.  Vascular pattern normal. Lungs clear except for minimal scarring or atelectasis in the lingula.  IMPRESSION: No significant acute abnormalities.   Electronically Signed   By: Esperanza Heir M.D.   On: 05/03/2013 13:11     Examination:  General appearance: alert, cooperative and mild distress Resp: clear to auscultation bilaterally Cardio: regular rate and rhythm GI: normal findings: bowel sounds normal  Wound Exam: clean, dry, intact   Drainage:  Scant/small amount Serosanguinous exudate  Motor Exam: EHL, FHL, Anterior Tibial and Posterior Tibial Intact  Sensory Exam: Superficial Peroneal, Deep Peroneal and Tibial normal  Vascular Exam: Left posterior tibial artery has trace pulse  Assessment:    3 Days Post-Op  Procedure(s) (LRB): TOTAL KNEE REVISION (Left)  ADDITIONAL DIAGNOSIS:  Principal Problem:   Painful total knee replacement Left knee Active Problems:   Failed total left knee replacement   S/P revision of total knee  Acute Blood Loss Anemia Symptomatic - improved with PRBC's   Plan: Physical Therapy as ordered Partial Weight Bearing @ 50% (PWB)  DVT Prophylaxis:  Xarelto, Foot Pumps and TED hose  DISCHARGE PLAN: Home today  DISCHARGE NEEDS: HHPT, CPM, Walker and 3-in-1 comode seat         PETRARCA,BRIAN 05/18/2013 8:22 AM

## 2013-05-18 NOTE — Progress Notes (Signed)
Patient discharged to home accompanied by family. Discharge instructions and rx given and explained and patient stated understanding. Patient instructed to follow up with Dr. [name] in 2 weeks. Patients IV was removed, dressing was changed, and left unit in a stable condition via wheelchair.

## 2013-05-18 NOTE — Progress Notes (Signed)
Physical Therapy Treatment Patient Details Name: Leah Duncan MRN: [id number] DOB: February 02, 1956 Today's Date: 05/18/2013 Time: 1025-1100 PT Time Calculation (min): 35 min  PT Assessment / Plan / Recommendation  History of Present Illness Painful, failed left total knee now s/p Left Total knee Revision.   PT Comments   POD # 3 pt plans to D/C to home today.  Pt doing better.  Received one unit yesterday.  Assisted OOB to amb to BR to void then to recliner.   Given handout HEP.  Family arrived so performed education and practiced going up one step backward with RW due to [facility]  Instructed on proper positioning of extension during rest and sleep.  Instructed on use of ICE.   Follow Up Recommendations  Home health PT;Supervision/Assistance - 24 hour     Does the patient have the potential to tolerate intense rehabilitation     Barriers to Discharge        Equipment Recommendations  Rolling walker with 5" wheels;3in1 (PT)    Recommendations for Other Services    Frequency     Progress towards PT Goals Progress towards PT goals: Progressing toward goals  Plan      Precautions / Restrictions Precautions Precautions: Fall;Knee Restrictions Weight Bearing Restrictions: Yes LLE Weight Bearing: Partial weight bearing LLE Partial Weight Bearing Percentage or Pounds: 50%    Pertinent Vitals/Pain C/o 6/10 meds requested ICE applied    Mobility  Bed Mobility Bed Mobility: Supine to Sit;Sitting - Scoot to Edge of Bed Supine to Sit: 4: Min guard Sitting - Scoot to [place] of Bed: 4: Min assist Details for Bed Mobility Assistance: Assist to support L LE and increased time Transfers Transfers: Sit to Stand Sit to Stand: 5: Supervision;4: Min guard;From toilet;From bed Stand to Sit: To chair/3-in-1;4: Min guard;To toilet;5: Supervision Details for Transfer Assistance: <25% VC's on hand placement and L LE advancement Ambulation/Gait Ambulation/Gait Assistance: 5: Supervision;4: Min  guard Ambulation Distance (Feet): 30 Feet Assistive device: Rolling walker Ambulation/Gait Assistance Details: <25% VC's on safety with turns and familt education on safe handling Gait Pattern: Step-to pattern;Decreased stance time - left;Trunk flexed Gait velocity: decreased General Gait Details: with family prersenty for education, demonstrated and practiced Stairs: Yes Stairs Assistance: 4: Min guard Stair Management Technique: No rails;Backwards;With walker Number of Stairs: 1     PT Goals (current goals can now be found in the care plan section)    Visit Information  Last PT Received On: 05/18/13 History of Present Illness: Painful, failed left total knee now s/p Left Total knee Revision.    Subjective Data      Cognition       Balance     End of Session PT - End of Session Equipment Utilized During Treatment: Gait belt Activity Tolerance: Patient tolerated treatment well Patient left: in chair;with call bell/phone within reach;with family/visitor present CPM Left Knee CPM Left Knee: Off Left Knee Flexion (Degrees): 50 Left Knee Extension (Degrees): 0   [name]  PTA [facility]  Acute  Rehab Pager      [phone]

## 2013-05-19 LAB — BODY FLUID CULTURE
Culture: NO GROWTH
Gram Stain: NONE SEEN

## 2013-05-20 LAB — ANAEROBIC CULTURE: Gram Stain: NONE SEEN

## 2013-06-12 LAB — FUNGUS CULTURE W SMEAR: Fungal Smear: NONE SEEN

## 2013-06-27 LAB — AFB CULTURE WITH SMEAR (NOT AT ARMC): Acid Fast Smear: NONE SEEN

## 2014-12-03 IMAGING — CR DG CHEST 2V
2 series · 2 of 2 positions shown · non-contrast
Comparison: 09/12/2012

CLINICAL DATA: Total knee revision left side

EXAM:
CHEST  2 VIEW

[w chest pa]
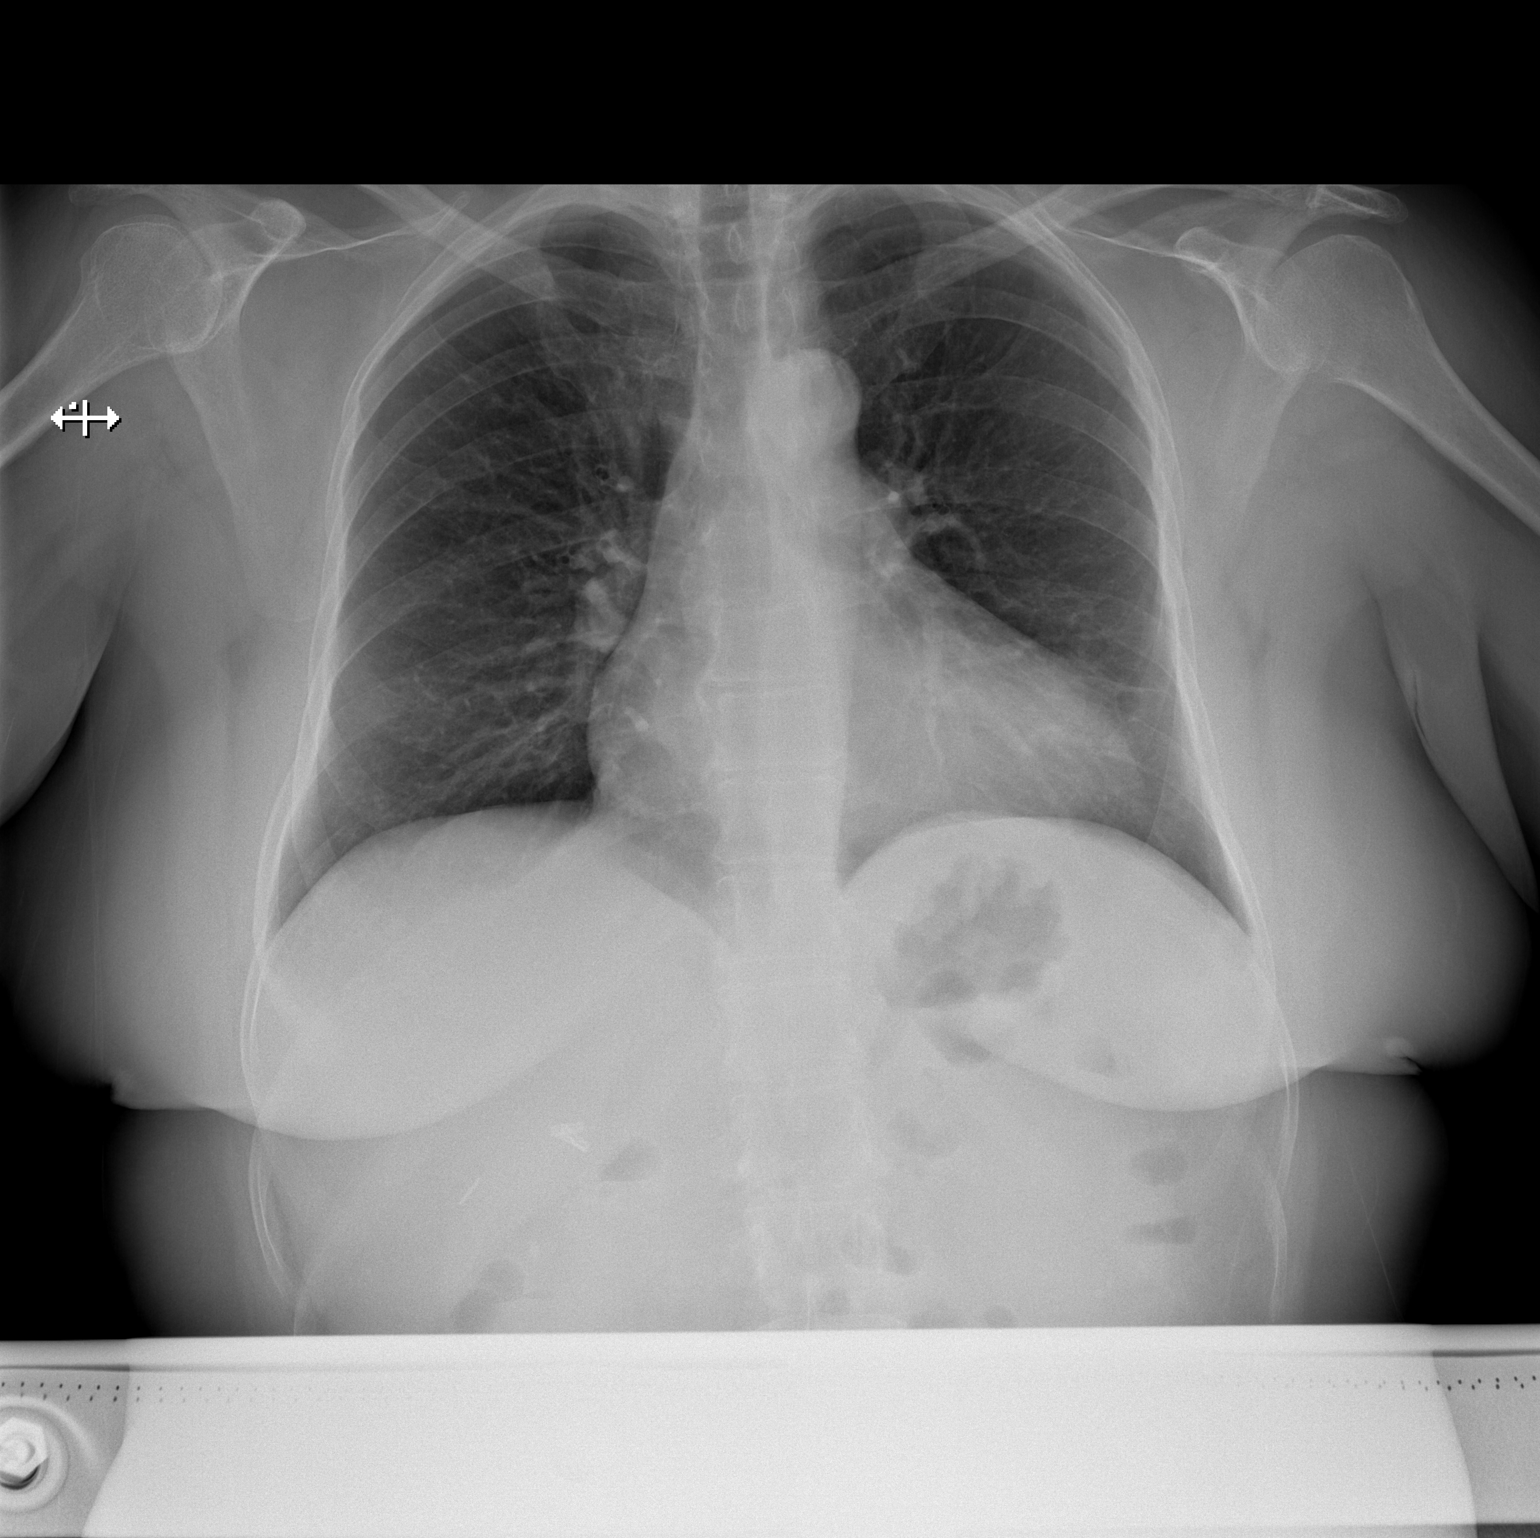

[w chest lat]
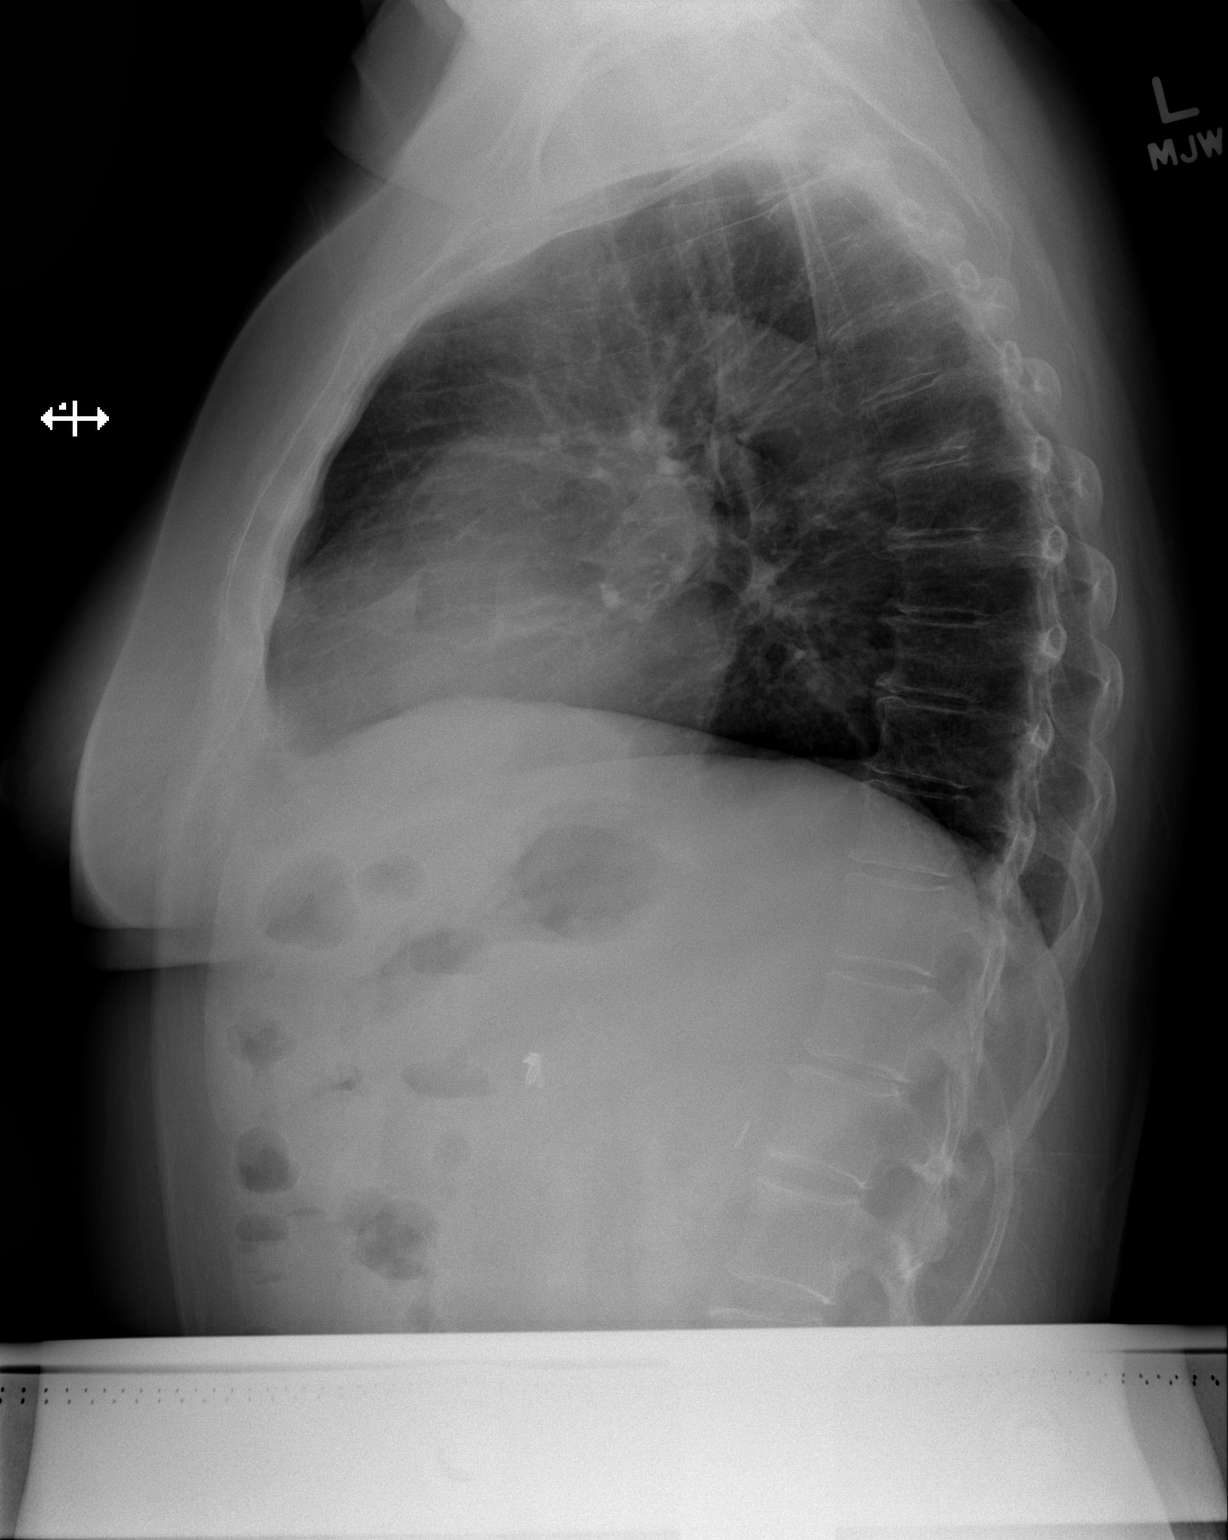

[2 of 2 positions shown; findings below may reference images not displayed]

FINDINGS: Mild cardiac enlargement. Vascular pattern normal. Lungs clear
except for minimal scarring or atelectasis in the lingula.
IMPRESSION: No significant acute abnormalities.

## 2016-05-19 ENCOUNTER — Ambulatory Visit (INDEPENDENT_AMBULATORY_CARE_PROVIDER_SITE_OTHER): Payer: Self-pay

## 2016-05-19 ENCOUNTER — Ambulatory Visit (INDEPENDENT_AMBULATORY_CARE_PROVIDER_SITE_OTHER): Payer: BLUE CROSS/BLUE SHIELD | Admitting: Orthopaedic Surgery

## 2016-05-19 ENCOUNTER — Encounter (INDEPENDENT_AMBULATORY_CARE_PROVIDER_SITE_OTHER): Payer: Self-pay | Admitting: Orthopaedic Surgery

## 2016-05-19 VITALS — BP 135/76 | HR 65 | Ht 60.0 in | Wt 185.0 lb

## 2016-05-19 DIAGNOSIS — M25562 Pain in left knee: Secondary | ICD-10-CM | POA: Diagnosis not present

## 2016-05-19 DIAGNOSIS — G8929 Other chronic pain: Secondary | ICD-10-CM | POA: Diagnosis not present

## 2016-05-19 DIAGNOSIS — M25561 Pain in right knee: Secondary | ICD-10-CM

## 2016-05-19 NOTE — Progress Notes (Signed)
Office Visit Note   Patient: Leah Duncan           Date of Birth: 03/16/1956           MRN: [id number] Visit Date: 05/19/2016              Requested by: No referring provider defined for this encounter. PCP: No primary care provider on file.   Assessment & Plan: Osteoarthritis both knees. Leah Duncan has had a prior arthroplasty of her left knee with subsequent revision she's still having some pain but clinically it seems to be just fine. She does have osteoarthritis of her right knee Plan: Follow up as needed. I think Leah Duncan's knees are fine in terms of any intervention. She has gained quite a bit of weight over the last several years and has not been exercising. I think if she were to lose the weight and excise it feel much better. Perhaps, after her upcoming lumbar spine surgery she'll have some relief of her leg pain.  Follow-Up Instructions: No Follow-up on file.   Orders:  No orders of the defined types were placed in this encounter.  No orders of the defined types were placed in this encounter.     Procedures: No procedures performed   Clinical Data: No additional findings.   Subjective: No chief complaint on file.   Pt complaining of BIL knee pain.  Left TKA 2 x...feels like the plate coming loose?   Leah Duncan is planning to have a lumbar laminectomy with Dr. Carloyn Manner in the very near future. She just had some concerns about her knee pain and came to the office for follow-up evaluation. She has had a prior diagnosis of osteoarthritis of her right knee. She's had a prior right total knee arthroplasty with subsequent revision for loosening. She notes she still having some discomfort in both knees. She has gained approximately 30 pounds in the last 3 years. She is really not exercising. He denies any fever or chills.  Review of Systems   Objective: Vital Signs: There were no vitals taken for this visit.  Physical Exam  Ortho Exam examination of the left  knee reveals no instability to the left knee was not hot warm red or swollen for extension and flexion of approximately 95. No calf pain or lower extremity swelling. I thought there was significant weakness of her quad muscles.  Exam of the right knee demonstrates medial joint pain with increased varus full extension about 100 of flexion without instability. No calf pain similar to the left lower extremity at that she had some weakness in her quads.  Specialty Comments:  No specialty comments available.  Imaging: No results found.   PMFS History: Patient Active Problem List   Diagnosis Date Noted  . Acute blood loss anemia 05/18/2013  . Painful total knee replacement Left knee 05/15/2013  . Failed total left knee replacement ([place]) 05/15/2013  . S/P revision of total knee 05/15/2013   Past Medical History:  Diagnosis Date  . Arthritis   . COPD (chronic obstructive pulmonary disease)   . Depression   . GERD (gastroesophageal reflux disease)   . Headache(784.0)   . Hypertension   . Kidney stones 02/2013  . Shortness of breath    anxiety, exertion    No family history on file.  Past Surgical History:  Procedure Laterality Date  . ABDOMINAL HYSTERECTOMY    . CHOLECYSTECTOMY    . HEMORRHOID SURGERY    . KNEE ARTHROSCOPY  Bilateral   . TOTAL KNEE ARTHROPLASTY Left 2011  . TOTAL KNEE ARTHROPLASTY Left 05/15/2013   Dr Durward Fortes  . TOTAL KNEE REVISION Left 05/15/2013   Procedure: TOTAL KNEE REVISION;  Surgeon: Garald Balding, MD;  Location: [place];  Service: Orthopedics;  Laterality: Left;   Social History   Occupational History  . Not on file.   Social History Main Topics  . Smoking status: Never Smoker  . Smokeless tobacco: Never Used  . Alcohol use No  . Drug use: No  . Sexual activity: Not on file

## 2017-04-27 ENCOUNTER — Ambulatory Visit (INDEPENDENT_AMBULATORY_CARE_PROVIDER_SITE_OTHER): Payer: BLUE CROSS/BLUE SHIELD | Admitting: Orthopaedic Surgery

## 2019-06-18 DIAGNOSIS — E7800 Pure hypercholesterolemia, unspecified: Secondary | ICD-10-CM | POA: Diagnosis not present

## 2019-06-18 DIAGNOSIS — E1165 Type 2 diabetes mellitus with hyperglycemia: Secondary | ICD-10-CM | POA: Diagnosis not present

## 2019-06-18 DIAGNOSIS — Z299 Encounter for prophylactic measures, unspecified: Secondary | ICD-10-CM | POA: Diagnosis not present

## 2019-06-18 DIAGNOSIS — I10 Essential (primary) hypertension: Secondary | ICD-10-CM | POA: Diagnosis not present

## 2019-06-18 DIAGNOSIS — G4700 Insomnia, unspecified: Secondary | ICD-10-CM | POA: Diagnosis not present

## 2019-07-12 DIAGNOSIS — J0100 Acute maxillary sinusitis, unspecified: Secondary | ICD-10-CM | POA: Diagnosis not present

## 2019-07-12 DIAGNOSIS — J40 Bronchitis, not specified as acute or chronic: Secondary | ICD-10-CM | POA: Diagnosis not present

## 2019-07-12 DIAGNOSIS — R05 Cough: Secondary | ICD-10-CM | POA: Diagnosis not present

## 2019-07-12 DIAGNOSIS — R509 Fever, unspecified: Secondary | ICD-10-CM | POA: Diagnosis not present

## 2019-07-20 DIAGNOSIS — M1711 Unilateral primary osteoarthritis, right knee: Secondary | ICD-10-CM | POA: Diagnosis not present

## 2019-07-30 DIAGNOSIS — M1711 Unilateral primary osteoarthritis, right knee: Secondary | ICD-10-CM | POA: Diagnosis not present

## 2019-07-30 DIAGNOSIS — M25561 Pain in right knee: Secondary | ICD-10-CM | POA: Diagnosis not present

## 2019-08-09 DIAGNOSIS — R809 Proteinuria, unspecified: Secondary | ICD-10-CM | POA: Diagnosis not present

## 2019-08-09 DIAGNOSIS — E1165 Type 2 diabetes mellitus with hyperglycemia: Secondary | ICD-10-CM | POA: Diagnosis not present

## 2019-08-09 DIAGNOSIS — I10 Essential (primary) hypertension: Secondary | ICD-10-CM | POA: Diagnosis not present

## 2019-08-09 DIAGNOSIS — E1129 Type 2 diabetes mellitus with other diabetic kidney complication: Secondary | ICD-10-CM | POA: Diagnosis not present

## 2019-08-09 DIAGNOSIS — M1710 Unilateral primary osteoarthritis, unspecified knee: Secondary | ICD-10-CM | POA: Diagnosis not present

## 2019-08-09 DIAGNOSIS — Z789 Other specified health status: Secondary | ICD-10-CM | POA: Diagnosis not present

## 2019-08-09 DIAGNOSIS — Z299 Encounter for prophylactic measures, unspecified: Secondary | ICD-10-CM | POA: Diagnosis not present

## 2019-08-12 DIAGNOSIS — R05 Cough: Secondary | ICD-10-CM | POA: Diagnosis not present

## 2019-08-12 DIAGNOSIS — J40 Bronchitis, not specified as acute or chronic: Secondary | ICD-10-CM | POA: Diagnosis not present

## 2019-08-12 DIAGNOSIS — R509 Fever, unspecified: Secondary | ICD-10-CM | POA: Diagnosis not present

## 2019-08-12 DIAGNOSIS — J0100 Acute maxillary sinusitis, unspecified: Secondary | ICD-10-CM | POA: Diagnosis not present

## 2019-08-15 DIAGNOSIS — H35033 Hypertensive retinopathy, bilateral: Secondary | ICD-10-CM | POA: Diagnosis not present

## 2019-08-27 DIAGNOSIS — Z22322 Carrier or suspected carrier of Methicillin resistant Staphylococcus aureus: Secondary | ICD-10-CM | POA: Diagnosis not present

## 2019-08-27 DIAGNOSIS — Z01812 Encounter for preprocedural laboratory examination: Secondary | ICD-10-CM | POA: Diagnosis not present

## 2019-08-27 DIAGNOSIS — R6889 Other general symptoms and signs: Secondary | ICD-10-CM | POA: Diagnosis not present

## 2019-09-12 DIAGNOSIS — J0100 Acute maxillary sinusitis, unspecified: Secondary | ICD-10-CM | POA: Diagnosis not present

## 2019-09-12 DIAGNOSIS — J40 Bronchitis, not specified as acute or chronic: Secondary | ICD-10-CM | POA: Diagnosis not present

## 2019-09-12 DIAGNOSIS — R05 Cough: Secondary | ICD-10-CM | POA: Diagnosis not present

## 2019-09-12 DIAGNOSIS — R509 Fever, unspecified: Secondary | ICD-10-CM | POA: Diagnosis not present

## 2019-09-17 DIAGNOSIS — Z22322 Carrier or suspected carrier of Methicillin resistant Staphylococcus aureus: Secondary | ICD-10-CM | POA: Diagnosis not present

## 2019-09-17 DIAGNOSIS — M170 Bilateral primary osteoarthritis of knee: Secondary | ICD-10-CM | POA: Diagnosis not present

## 2019-09-17 DIAGNOSIS — T84093D Other mechanical complication of internal left knee prosthesis, subsequent encounter: Secondary | ICD-10-CM | POA: Diagnosis not present

## 2019-09-21 DIAGNOSIS — Z96652 Presence of left artificial knee joint: Secondary | ICD-10-CM | POA: Diagnosis not present

## 2019-09-21 DIAGNOSIS — E119 Type 2 diabetes mellitus without complications: Secondary | ICD-10-CM | POA: Diagnosis not present

## 2019-09-21 DIAGNOSIS — K219 Gastro-esophageal reflux disease without esophagitis: Secondary | ICD-10-CM | POA: Diagnosis not present

## 2019-09-21 DIAGNOSIS — Z01818 Encounter for other preprocedural examination: Secondary | ICD-10-CM | POA: Diagnosis not present

## 2019-09-21 DIAGNOSIS — Z8616 Personal history of COVID-19: Secondary | ICD-10-CM | POA: Diagnosis not present

## 2019-09-21 DIAGNOSIS — Z9049 Acquired absence of other specified parts of digestive tract: Secondary | ICD-10-CM | POA: Diagnosis not present

## 2019-09-21 DIAGNOSIS — E785 Hyperlipidemia, unspecified: Secondary | ICD-10-CM | POA: Diagnosis not present

## 2019-09-21 DIAGNOSIS — Z79899 Other long term (current) drug therapy: Secondary | ICD-10-CM | POA: Diagnosis not present

## 2019-09-21 DIAGNOSIS — J449 Chronic obstructive pulmonary disease, unspecified: Secondary | ICD-10-CM | POA: Diagnosis not present

## 2019-09-21 DIAGNOSIS — I10 Essential (primary) hypertension: Secondary | ICD-10-CM | POA: Diagnosis not present

## 2019-09-21 DIAGNOSIS — Z22322 Carrier or suspected carrier of Methicillin resistant Staphylococcus aureus: Secondary | ICD-10-CM | POA: Diagnosis not present

## 2019-09-21 DIAGNOSIS — M1711 Unilateral primary osteoarthritis, right knee: Secondary | ICD-10-CM | POA: Diagnosis not present

## 2019-09-21 DIAGNOSIS — Z01812 Encounter for preprocedural laboratory examination: Secondary | ICD-10-CM | POA: Diagnosis not present

## 2019-09-21 DIAGNOSIS — Z981 Arthrodesis status: Secondary | ICD-10-CM | POA: Diagnosis not present

## 2019-09-25 DIAGNOSIS — M1711 Unilateral primary osteoarthritis, right knee: Secondary | ICD-10-CM | POA: Diagnosis not present

## 2019-09-25 DIAGNOSIS — K219 Gastro-esophageal reflux disease without esophagitis: Secondary | ICD-10-CM | POA: Diagnosis not present

## 2019-09-25 DIAGNOSIS — G8918 Other acute postprocedural pain: Secondary | ICD-10-CM | POA: Diagnosis not present

## 2019-09-25 DIAGNOSIS — Z96652 Presence of left artificial knee joint: Secondary | ICD-10-CM | POA: Diagnosis not present

## 2019-09-25 DIAGNOSIS — E785 Hyperlipidemia, unspecified: Secondary | ICD-10-CM | POA: Diagnosis not present

## 2019-09-25 DIAGNOSIS — E119 Type 2 diabetes mellitus without complications: Secondary | ICD-10-CM | POA: Diagnosis not present

## 2019-09-25 DIAGNOSIS — Z96651 Presence of right artificial knee joint: Secondary | ICD-10-CM | POA: Diagnosis not present

## 2019-09-25 DIAGNOSIS — J449 Chronic obstructive pulmonary disease, unspecified: Secondary | ICD-10-CM | POA: Diagnosis not present

## 2019-09-25 DIAGNOSIS — Z22322 Carrier or suspected carrier of Methicillin resistant Staphylococcus aureus: Secondary | ICD-10-CM | POA: Diagnosis not present

## 2019-09-25 DIAGNOSIS — Z981 Arthrodesis status: Secondary | ICD-10-CM | POA: Diagnosis not present

## 2019-09-25 DIAGNOSIS — Z79899 Other long term (current) drug therapy: Secondary | ICD-10-CM | POA: Diagnosis not present

## 2019-09-25 DIAGNOSIS — J45909 Unspecified asthma, uncomplicated: Secondary | ICD-10-CM | POA: Diagnosis not present

## 2019-09-25 DIAGNOSIS — Z471 Aftercare following joint replacement surgery: Secondary | ICD-10-CM | POA: Diagnosis not present

## 2019-09-25 DIAGNOSIS — Z9049 Acquired absence of other specified parts of digestive tract: Secondary | ICD-10-CM | POA: Diagnosis not present

## 2019-09-25 DIAGNOSIS — Z8616 Personal history of COVID-19: Secondary | ICD-10-CM | POA: Diagnosis not present

## 2019-09-25 DIAGNOSIS — I10 Essential (primary) hypertension: Secondary | ICD-10-CM | POA: Diagnosis not present

## 2019-09-26 DIAGNOSIS — Z9049 Acquired absence of other specified parts of digestive tract: Secondary | ICD-10-CM | POA: Diagnosis not present

## 2019-09-26 DIAGNOSIS — J449 Chronic obstructive pulmonary disease, unspecified: Secondary | ICD-10-CM | POA: Diagnosis not present

## 2019-09-26 DIAGNOSIS — Z22322 Carrier or suspected carrier of Methicillin resistant Staphylococcus aureus: Secondary | ICD-10-CM | POA: Diagnosis not present

## 2019-09-26 DIAGNOSIS — M1711 Unilateral primary osteoarthritis, right knee: Secondary | ICD-10-CM | POA: Diagnosis not present

## 2019-09-26 DIAGNOSIS — Z981 Arthrodesis status: Secondary | ICD-10-CM | POA: Diagnosis not present

## 2019-09-26 DIAGNOSIS — E119 Type 2 diabetes mellitus without complications: Secondary | ICD-10-CM | POA: Diagnosis not present

## 2019-09-26 DIAGNOSIS — E785 Hyperlipidemia, unspecified: Secondary | ICD-10-CM | POA: Diagnosis not present

## 2019-09-26 DIAGNOSIS — K219 Gastro-esophageal reflux disease without esophagitis: Secondary | ICD-10-CM | POA: Diagnosis not present

## 2019-09-26 DIAGNOSIS — Z8616 Personal history of COVID-19: Secondary | ICD-10-CM | POA: Diagnosis not present

## 2019-09-26 DIAGNOSIS — I10 Essential (primary) hypertension: Secondary | ICD-10-CM | POA: Diagnosis not present

## 2019-09-26 DIAGNOSIS — Z79899 Other long term (current) drug therapy: Secondary | ICD-10-CM | POA: Diagnosis not present

## 2019-09-26 DIAGNOSIS — Z96652 Presence of left artificial knee joint: Secondary | ICD-10-CM | POA: Diagnosis not present

## 2019-09-27 DIAGNOSIS — Z96652 Presence of left artificial knee joint: Secondary | ICD-10-CM | POA: Diagnosis not present

## 2019-09-27 DIAGNOSIS — Z96651 Presence of right artificial knee joint: Secondary | ICD-10-CM | POA: Diagnosis not present

## 2019-09-27 DIAGNOSIS — Z79899 Other long term (current) drug therapy: Secondary | ICD-10-CM | POA: Diagnosis not present

## 2019-09-27 DIAGNOSIS — M1711 Unilateral primary osteoarthritis, right knee: Secondary | ICD-10-CM | POA: Diagnosis not present

## 2019-09-27 DIAGNOSIS — E119 Type 2 diabetes mellitus without complications: Secondary | ICD-10-CM | POA: Diagnosis not present

## 2019-09-27 DIAGNOSIS — E785 Hyperlipidemia, unspecified: Secondary | ICD-10-CM | POA: Diagnosis not present

## 2019-09-27 DIAGNOSIS — Z981 Arthrodesis status: Secondary | ICD-10-CM | POA: Diagnosis not present

## 2019-09-27 DIAGNOSIS — Z9049 Acquired absence of other specified parts of digestive tract: Secondary | ICD-10-CM | POA: Diagnosis not present

## 2019-09-27 DIAGNOSIS — I10 Essential (primary) hypertension: Secondary | ICD-10-CM | POA: Diagnosis not present

## 2019-09-27 DIAGNOSIS — Z22322 Carrier or suspected carrier of Methicillin resistant Staphylococcus aureus: Secondary | ICD-10-CM | POA: Diagnosis not present

## 2019-09-27 DIAGNOSIS — Z8616 Personal history of COVID-19: Secondary | ICD-10-CM | POA: Diagnosis not present

## 2019-09-27 DIAGNOSIS — K219 Gastro-esophageal reflux disease without esophagitis: Secondary | ICD-10-CM | POA: Diagnosis not present

## 2019-09-27 DIAGNOSIS — J449 Chronic obstructive pulmonary disease, unspecified: Secondary | ICD-10-CM | POA: Diagnosis not present

## 2019-09-28 DIAGNOSIS — Z96653 Presence of artificial knee joint, bilateral: Secondary | ICD-10-CM | POA: Diagnosis not present

## 2019-09-28 DIAGNOSIS — J449 Chronic obstructive pulmonary disease, unspecified: Secondary | ICD-10-CM | POA: Diagnosis not present

## 2019-09-28 DIAGNOSIS — K5900 Constipation, unspecified: Secondary | ICD-10-CM | POA: Diagnosis not present

## 2019-09-28 DIAGNOSIS — K219 Gastro-esophageal reflux disease without esophagitis: Secondary | ICD-10-CM | POA: Diagnosis not present

## 2019-09-28 DIAGNOSIS — R42 Dizziness and giddiness: Secondary | ICD-10-CM | POA: Diagnosis not present

## 2019-09-28 DIAGNOSIS — E785 Hyperlipidemia, unspecified: Secondary | ICD-10-CM | POA: Diagnosis not present

## 2019-09-28 DIAGNOSIS — R3915 Urgency of urination: Secondary | ICD-10-CM | POA: Diagnosis not present

## 2019-09-28 DIAGNOSIS — E119 Type 2 diabetes mellitus without complications: Secondary | ICD-10-CM | POA: Diagnosis not present

## 2019-09-28 DIAGNOSIS — I10 Essential (primary) hypertension: Secondary | ICD-10-CM | POA: Diagnosis not present

## 2019-09-28 DIAGNOSIS — M47819 Spondylosis without myelopathy or radiculopathy, site unspecified: Secondary | ICD-10-CM | POA: Diagnosis not present

## 2019-09-28 DIAGNOSIS — R350 Frequency of micturition: Secondary | ICD-10-CM | POA: Diagnosis not present

## 2019-09-28 DIAGNOSIS — N200 Calculus of kidney: Secondary | ICD-10-CM | POA: Diagnosis not present

## 2019-09-28 DIAGNOSIS — Z471 Aftercare following joint replacement surgery: Secondary | ICD-10-CM | POA: Diagnosis not present

## 2019-10-03 DIAGNOSIS — Z471 Aftercare following joint replacement surgery: Secondary | ICD-10-CM | POA: Diagnosis not present

## 2019-10-03 DIAGNOSIS — K5900 Constipation, unspecified: Secondary | ICD-10-CM | POA: Diagnosis not present

## 2019-10-03 DIAGNOSIS — J449 Chronic obstructive pulmonary disease, unspecified: Secondary | ICD-10-CM | POA: Diagnosis not present

## 2019-10-03 DIAGNOSIS — M47819 Spondylosis without myelopathy or radiculopathy, site unspecified: Secondary | ICD-10-CM | POA: Diagnosis not present

## 2019-10-03 DIAGNOSIS — N200 Calculus of kidney: Secondary | ICD-10-CM | POA: Diagnosis not present

## 2019-10-03 DIAGNOSIS — Z96653 Presence of artificial knee joint, bilateral: Secondary | ICD-10-CM | POA: Diagnosis not present

## 2019-10-03 DIAGNOSIS — R350 Frequency of micturition: Secondary | ICD-10-CM | POA: Diagnosis not present

## 2019-10-03 DIAGNOSIS — E785 Hyperlipidemia, unspecified: Secondary | ICD-10-CM | POA: Diagnosis not present

## 2019-10-03 DIAGNOSIS — K219 Gastro-esophageal reflux disease without esophagitis: Secondary | ICD-10-CM | POA: Diagnosis not present

## 2019-10-03 DIAGNOSIS — I10 Essential (primary) hypertension: Secondary | ICD-10-CM | POA: Diagnosis not present

## 2019-10-03 DIAGNOSIS — R42 Dizziness and giddiness: Secondary | ICD-10-CM | POA: Diagnosis not present

## 2019-10-03 DIAGNOSIS — R3915 Urgency of urination: Secondary | ICD-10-CM | POA: Diagnosis not present

## 2019-10-03 DIAGNOSIS — E119 Type 2 diabetes mellitus without complications: Secondary | ICD-10-CM | POA: Diagnosis not present

## 2019-10-05 DIAGNOSIS — Z299 Encounter for prophylactic measures, unspecified: Secondary | ICD-10-CM | POA: Diagnosis not present

## 2019-10-05 DIAGNOSIS — J449 Chronic obstructive pulmonary disease, unspecified: Secondary | ICD-10-CM | POA: Diagnosis not present

## 2019-10-05 DIAGNOSIS — K5900 Constipation, unspecified: Secondary | ICD-10-CM | POA: Diagnosis not present

## 2019-10-05 DIAGNOSIS — K219 Gastro-esophageal reflux disease without esophagitis: Secondary | ICD-10-CM | POA: Diagnosis not present

## 2019-10-05 DIAGNOSIS — Z96653 Presence of artificial knee joint, bilateral: Secondary | ICD-10-CM | POA: Diagnosis not present

## 2019-10-05 DIAGNOSIS — Z471 Aftercare following joint replacement surgery: Secondary | ICD-10-CM | POA: Diagnosis not present

## 2019-10-05 DIAGNOSIS — R350 Frequency of micturition: Secondary | ICD-10-CM | POA: Diagnosis not present

## 2019-10-05 DIAGNOSIS — E1165 Type 2 diabetes mellitus with hyperglycemia: Secondary | ICD-10-CM | POA: Diagnosis not present

## 2019-10-05 DIAGNOSIS — I10 Essential (primary) hypertension: Secondary | ICD-10-CM | POA: Diagnosis not present

## 2019-10-05 DIAGNOSIS — R42 Dizziness and giddiness: Secondary | ICD-10-CM | POA: Diagnosis not present

## 2019-10-05 DIAGNOSIS — E119 Type 2 diabetes mellitus without complications: Secondary | ICD-10-CM | POA: Diagnosis not present

## 2019-10-05 DIAGNOSIS — R3915 Urgency of urination: Secondary | ICD-10-CM | POA: Diagnosis not present

## 2019-10-05 DIAGNOSIS — M47819 Spondylosis without myelopathy or radiculopathy, site unspecified: Secondary | ICD-10-CM | POA: Diagnosis not present

## 2019-10-05 DIAGNOSIS — N200 Calculus of kidney: Secondary | ICD-10-CM | POA: Diagnosis not present

## 2019-10-05 DIAGNOSIS — E785 Hyperlipidemia, unspecified: Secondary | ICD-10-CM | POA: Diagnosis not present

## 2019-10-06 DIAGNOSIS — Z96653 Presence of artificial knee joint, bilateral: Secondary | ICD-10-CM | POA: Diagnosis not present

## 2019-10-06 DIAGNOSIS — K219 Gastro-esophageal reflux disease without esophagitis: Secondary | ICD-10-CM | POA: Diagnosis not present

## 2019-10-06 DIAGNOSIS — M47819 Spondylosis without myelopathy or radiculopathy, site unspecified: Secondary | ICD-10-CM | POA: Diagnosis not present

## 2019-10-06 DIAGNOSIS — E785 Hyperlipidemia, unspecified: Secondary | ICD-10-CM | POA: Diagnosis not present

## 2019-10-06 DIAGNOSIS — N200 Calculus of kidney: Secondary | ICD-10-CM | POA: Diagnosis not present

## 2019-10-06 DIAGNOSIS — R3915 Urgency of urination: Secondary | ICD-10-CM | POA: Diagnosis not present

## 2019-10-06 DIAGNOSIS — R350 Frequency of micturition: Secondary | ICD-10-CM | POA: Diagnosis not present

## 2019-10-06 DIAGNOSIS — J449 Chronic obstructive pulmonary disease, unspecified: Secondary | ICD-10-CM | POA: Diagnosis not present

## 2019-10-06 DIAGNOSIS — E119 Type 2 diabetes mellitus without complications: Secondary | ICD-10-CM | POA: Diagnosis not present

## 2019-10-06 DIAGNOSIS — R42 Dizziness and giddiness: Secondary | ICD-10-CM | POA: Diagnosis not present

## 2019-10-06 DIAGNOSIS — I10 Essential (primary) hypertension: Secondary | ICD-10-CM | POA: Diagnosis not present

## 2019-10-06 DIAGNOSIS — Z471 Aftercare following joint replacement surgery: Secondary | ICD-10-CM | POA: Diagnosis not present

## 2019-10-06 DIAGNOSIS — K5900 Constipation, unspecified: Secondary | ICD-10-CM | POA: Diagnosis not present

## 2019-10-08 DIAGNOSIS — K219 Gastro-esophageal reflux disease without esophagitis: Secondary | ICD-10-CM | POA: Diagnosis not present

## 2019-10-08 DIAGNOSIS — E119 Type 2 diabetes mellitus without complications: Secondary | ICD-10-CM | POA: Diagnosis not present

## 2019-10-08 DIAGNOSIS — I10 Essential (primary) hypertension: Secondary | ICD-10-CM | POA: Diagnosis not present

## 2019-10-08 DIAGNOSIS — E785 Hyperlipidemia, unspecified: Secondary | ICD-10-CM | POA: Diagnosis not present

## 2019-10-08 DIAGNOSIS — R42 Dizziness and giddiness: Secondary | ICD-10-CM | POA: Diagnosis not present

## 2019-10-08 DIAGNOSIS — K5900 Constipation, unspecified: Secondary | ICD-10-CM | POA: Diagnosis not present

## 2019-10-08 DIAGNOSIS — Z471 Aftercare following joint replacement surgery: Secondary | ICD-10-CM | POA: Diagnosis not present

## 2019-10-08 DIAGNOSIS — N200 Calculus of kidney: Secondary | ICD-10-CM | POA: Diagnosis not present

## 2019-10-08 DIAGNOSIS — J449 Chronic obstructive pulmonary disease, unspecified: Secondary | ICD-10-CM | POA: Diagnosis not present

## 2019-10-08 DIAGNOSIS — Z96653 Presence of artificial knee joint, bilateral: Secondary | ICD-10-CM | POA: Diagnosis not present

## 2019-10-08 DIAGNOSIS — M47819 Spondylosis without myelopathy or radiculopathy, site unspecified: Secondary | ICD-10-CM | POA: Diagnosis not present

## 2019-10-08 DIAGNOSIS — R3915 Urgency of urination: Secondary | ICD-10-CM | POA: Diagnosis not present

## 2019-10-08 DIAGNOSIS — R350 Frequency of micturition: Secondary | ICD-10-CM | POA: Diagnosis not present

## 2019-10-10 DIAGNOSIS — I10 Essential (primary) hypertension: Secondary | ICD-10-CM | POA: Diagnosis not present

## 2019-10-10 DIAGNOSIS — K219 Gastro-esophageal reflux disease without esophagitis: Secondary | ICD-10-CM | POA: Diagnosis not present

## 2019-10-10 DIAGNOSIS — M47819 Spondylosis without myelopathy or radiculopathy, site unspecified: Secondary | ICD-10-CM | POA: Diagnosis not present

## 2019-10-10 DIAGNOSIS — Z471 Aftercare following joint replacement surgery: Secondary | ICD-10-CM | POA: Diagnosis not present

## 2019-10-10 DIAGNOSIS — E785 Hyperlipidemia, unspecified: Secondary | ICD-10-CM | POA: Diagnosis not present

## 2019-10-10 DIAGNOSIS — R42 Dizziness and giddiness: Secondary | ICD-10-CM | POA: Diagnosis not present

## 2019-10-10 DIAGNOSIS — J449 Chronic obstructive pulmonary disease, unspecified: Secondary | ICD-10-CM | POA: Diagnosis not present

## 2019-10-10 DIAGNOSIS — M170 Bilateral primary osteoarthritis of knee: Secondary | ICD-10-CM | POA: Diagnosis not present

## 2019-10-10 DIAGNOSIS — N200 Calculus of kidney: Secondary | ICD-10-CM | POA: Diagnosis not present

## 2019-10-10 DIAGNOSIS — Z96653 Presence of artificial knee joint, bilateral: Secondary | ICD-10-CM | POA: Diagnosis not present

## 2019-10-10 DIAGNOSIS — K5900 Constipation, unspecified: Secondary | ICD-10-CM | POA: Diagnosis not present

## 2019-10-10 DIAGNOSIS — Z96651 Presence of right artificial knee joint: Secondary | ICD-10-CM | POA: Diagnosis not present

## 2019-10-10 DIAGNOSIS — R350 Frequency of micturition: Secondary | ICD-10-CM | POA: Diagnosis not present

## 2019-10-10 DIAGNOSIS — R3915 Urgency of urination: Secondary | ICD-10-CM | POA: Diagnosis not present

## 2019-10-10 DIAGNOSIS — E119 Type 2 diabetes mellitus without complications: Secondary | ICD-10-CM | POA: Diagnosis not present

## 2019-10-11 DIAGNOSIS — E119 Type 2 diabetes mellitus without complications: Secondary | ICD-10-CM | POA: Diagnosis not present

## 2019-10-11 DIAGNOSIS — M47819 Spondylosis without myelopathy or radiculopathy, site unspecified: Secondary | ICD-10-CM | POA: Diagnosis not present

## 2019-10-11 DIAGNOSIS — R42 Dizziness and giddiness: Secondary | ICD-10-CM | POA: Diagnosis not present

## 2019-10-11 DIAGNOSIS — Z96653 Presence of artificial knee joint, bilateral: Secondary | ICD-10-CM | POA: Diagnosis not present

## 2019-10-11 DIAGNOSIS — I10 Essential (primary) hypertension: Secondary | ICD-10-CM | POA: Diagnosis not present

## 2019-10-11 DIAGNOSIS — E785 Hyperlipidemia, unspecified: Secondary | ICD-10-CM | POA: Diagnosis not present

## 2019-10-11 DIAGNOSIS — J449 Chronic obstructive pulmonary disease, unspecified: Secondary | ICD-10-CM | POA: Diagnosis not present

## 2019-10-11 DIAGNOSIS — K219 Gastro-esophageal reflux disease without esophagitis: Secondary | ICD-10-CM | POA: Diagnosis not present

## 2019-10-11 DIAGNOSIS — N200 Calculus of kidney: Secondary | ICD-10-CM | POA: Diagnosis not present

## 2019-10-11 DIAGNOSIS — R350 Frequency of micturition: Secondary | ICD-10-CM | POA: Diagnosis not present

## 2019-10-11 DIAGNOSIS — K5900 Constipation, unspecified: Secondary | ICD-10-CM | POA: Diagnosis not present

## 2019-10-11 DIAGNOSIS — R3915 Urgency of urination: Secondary | ICD-10-CM | POA: Diagnosis not present

## 2019-10-11 DIAGNOSIS — Z471 Aftercare following joint replacement surgery: Secondary | ICD-10-CM | POA: Diagnosis not present

## 2019-10-12 DIAGNOSIS — R509 Fever, unspecified: Secondary | ICD-10-CM | POA: Diagnosis not present

## 2019-10-12 DIAGNOSIS — J40 Bronchitis, not specified as acute or chronic: Secondary | ICD-10-CM | POA: Diagnosis not present

## 2019-10-12 DIAGNOSIS — R05 Cough: Secondary | ICD-10-CM | POA: Diagnosis not present

## 2019-10-12 DIAGNOSIS — J0100 Acute maxillary sinusitis, unspecified: Secondary | ICD-10-CM | POA: Diagnosis not present

## 2019-10-16 DIAGNOSIS — R262 Difficulty in walking, not elsewhere classified: Secondary | ICD-10-CM | POA: Diagnosis not present

## 2019-10-16 DIAGNOSIS — M170 Bilateral primary osteoarthritis of knee: Secondary | ICD-10-CM | POA: Diagnosis not present

## 2019-10-16 DIAGNOSIS — Z96651 Presence of right artificial knee joint: Secondary | ICD-10-CM | POA: Diagnosis not present

## 2019-10-19 DIAGNOSIS — R262 Difficulty in walking, not elsewhere classified: Secondary | ICD-10-CM | POA: Diagnosis not present

## 2019-10-19 DIAGNOSIS — M170 Bilateral primary osteoarthritis of knee: Secondary | ICD-10-CM | POA: Diagnosis not present

## 2019-10-19 DIAGNOSIS — Z96651 Presence of right artificial knee joint: Secondary | ICD-10-CM | POA: Diagnosis not present

## 2019-10-23 DIAGNOSIS — M170 Bilateral primary osteoarthritis of knee: Secondary | ICD-10-CM | POA: Diagnosis not present

## 2019-10-23 DIAGNOSIS — Z96651 Presence of right artificial knee joint: Secondary | ICD-10-CM | POA: Diagnosis not present

## 2019-10-23 DIAGNOSIS — R262 Difficulty in walking, not elsewhere classified: Secondary | ICD-10-CM | POA: Diagnosis not present

## 2019-10-30 DIAGNOSIS — Z96651 Presence of right artificial knee joint: Secondary | ICD-10-CM | POA: Diagnosis not present

## 2019-10-30 DIAGNOSIS — M170 Bilateral primary osteoarthritis of knee: Secondary | ICD-10-CM | POA: Diagnosis not present

## 2019-10-30 DIAGNOSIS — R262 Difficulty in walking, not elsewhere classified: Secondary | ICD-10-CM | POA: Diagnosis not present

## 2019-11-02 DIAGNOSIS — R262 Difficulty in walking, not elsewhere classified: Secondary | ICD-10-CM | POA: Diagnosis not present

## 2019-11-02 DIAGNOSIS — M170 Bilateral primary osteoarthritis of knee: Secondary | ICD-10-CM | POA: Diagnosis not present

## 2019-11-02 DIAGNOSIS — Z96651 Presence of right artificial knee joint: Secondary | ICD-10-CM | POA: Diagnosis not present

## 2019-11-07 DIAGNOSIS — M170 Bilateral primary osteoarthritis of knee: Secondary | ICD-10-CM | POA: Diagnosis not present

## 2019-11-07 DIAGNOSIS — R262 Difficulty in walking, not elsewhere classified: Secondary | ICD-10-CM | POA: Diagnosis not present

## 2019-11-07 DIAGNOSIS — Z96651 Presence of right artificial knee joint: Secondary | ICD-10-CM | POA: Diagnosis not present

## 2019-11-08 DIAGNOSIS — M170 Bilateral primary osteoarthritis of knee: Secondary | ICD-10-CM | POA: Diagnosis not present

## 2019-11-08 DIAGNOSIS — Z96651 Presence of right artificial knee joint: Secondary | ICD-10-CM | POA: Diagnosis not present

## 2019-11-08 DIAGNOSIS — R262 Difficulty in walking, not elsewhere classified: Secondary | ICD-10-CM | POA: Diagnosis not present

## 2019-11-12 DIAGNOSIS — J40 Bronchitis, not specified as acute or chronic: Secondary | ICD-10-CM | POA: Diagnosis not present

## 2019-11-12 DIAGNOSIS — R05 Cough: Secondary | ICD-10-CM | POA: Diagnosis not present

## 2019-11-12 DIAGNOSIS — J0100 Acute maxillary sinusitis, unspecified: Secondary | ICD-10-CM | POA: Diagnosis not present

## 2019-11-12 DIAGNOSIS — R509 Fever, unspecified: Secondary | ICD-10-CM | POA: Diagnosis not present

## 2019-11-22 DIAGNOSIS — I10 Essential (primary) hypertension: Secondary | ICD-10-CM | POA: Diagnosis not present

## 2019-11-22 DIAGNOSIS — B079 Viral wart, unspecified: Secondary | ICD-10-CM | POA: Diagnosis not present

## 2019-11-22 DIAGNOSIS — E1165 Type 2 diabetes mellitus with hyperglycemia: Secondary | ICD-10-CM | POA: Diagnosis not present

## 2019-11-22 DIAGNOSIS — Z299 Encounter for prophylactic measures, unspecified: Secondary | ICD-10-CM | POA: Diagnosis not present

## 2019-12-03 DIAGNOSIS — Z299 Encounter for prophylactic measures, unspecified: Secondary | ICD-10-CM | POA: Diagnosis not present

## 2019-12-03 DIAGNOSIS — R42 Dizziness and giddiness: Secondary | ICD-10-CM | POA: Diagnosis not present

## 2019-12-03 DIAGNOSIS — I10 Essential (primary) hypertension: Secondary | ICD-10-CM | POA: Diagnosis not present

## 2019-12-03 DIAGNOSIS — E1165 Type 2 diabetes mellitus with hyperglycemia: Secondary | ICD-10-CM | POA: Diagnosis not present

## 2019-12-12 DIAGNOSIS — E1165 Type 2 diabetes mellitus with hyperglycemia: Secondary | ICD-10-CM | POA: Diagnosis not present

## 2019-12-12 DIAGNOSIS — I10 Essential (primary) hypertension: Secondary | ICD-10-CM | POA: Diagnosis not present

## 2019-12-12 DIAGNOSIS — K219 Gastro-esophageal reflux disease without esophagitis: Secondary | ICD-10-CM | POA: Diagnosis not present

## 2019-12-12 DIAGNOSIS — R05 Cough: Secondary | ICD-10-CM | POA: Diagnosis not present

## 2019-12-12 DIAGNOSIS — R509 Fever, unspecified: Secondary | ICD-10-CM | POA: Diagnosis not present

## 2019-12-12 DIAGNOSIS — J40 Bronchitis, not specified as acute or chronic: Secondary | ICD-10-CM | POA: Diagnosis not present

## 2019-12-12 DIAGNOSIS — J0100 Acute maxillary sinusitis, unspecified: Secondary | ICD-10-CM | POA: Diagnosis not present

## 2019-12-25 DIAGNOSIS — Z96651 Presence of right artificial knee joint: Secondary | ICD-10-CM | POA: Diagnosis not present

## 2020-01-11 DIAGNOSIS — I10 Essential (primary) hypertension: Secondary | ICD-10-CM | POA: Diagnosis not present

## 2020-01-11 DIAGNOSIS — K219 Gastro-esophageal reflux disease without esophagitis: Secondary | ICD-10-CM | POA: Diagnosis not present

## 2020-01-11 DIAGNOSIS — E1165 Type 2 diabetes mellitus with hyperglycemia: Secondary | ICD-10-CM | POA: Diagnosis not present

## 2020-01-26 DIAGNOSIS — J440 Chronic obstructive pulmonary disease with acute lower respiratory infection: Secondary | ICD-10-CM | POA: Diagnosis not present

## 2020-01-26 DIAGNOSIS — J1282 Pneumonia due to coronavirus disease 2019: Secondary | ICD-10-CM | POA: Diagnosis not present

## 2020-01-26 DIAGNOSIS — E860 Dehydration: Secondary | ICD-10-CM | POA: Diagnosis not present

## 2020-01-26 DIAGNOSIS — J189 Pneumonia, unspecified organism: Secondary | ICD-10-CM | POA: Diagnosis not present

## 2020-01-26 DIAGNOSIS — E119 Type 2 diabetes mellitus without complications: Secondary | ICD-10-CM | POA: Diagnosis not present

## 2020-01-26 DIAGNOSIS — R079 Chest pain, unspecified: Secondary | ICD-10-CM | POA: Diagnosis not present

## 2020-01-26 DIAGNOSIS — I10 Essential (primary) hypertension: Secondary | ICD-10-CM | POA: Diagnosis not present

## 2020-01-26 DIAGNOSIS — R0602 Shortness of breath: Secondary | ICD-10-CM | POA: Diagnosis not present

## 2020-01-26 DIAGNOSIS — R05 Cough: Secondary | ICD-10-CM | POA: Diagnosis not present

## 2020-01-26 DIAGNOSIS — K219 Gastro-esophageal reflux disease without esophagitis: Secondary | ICD-10-CM | POA: Diagnosis not present

## 2020-01-26 DIAGNOSIS — U071 COVID-19: Secondary | ICD-10-CM | POA: Diagnosis not present

## 2020-01-26 DIAGNOSIS — R110 Nausea: Secondary | ICD-10-CM | POA: Diagnosis not present

## 2020-02-01 DIAGNOSIS — Z299 Encounter for prophylactic measures, unspecified: Secondary | ICD-10-CM | POA: Diagnosis not present

## 2020-02-01 DIAGNOSIS — I10 Essential (primary) hypertension: Secondary | ICD-10-CM | POA: Diagnosis not present

## 2020-02-01 DIAGNOSIS — U071 COVID-19: Secondary | ICD-10-CM | POA: Diagnosis not present

## 2020-02-01 DIAGNOSIS — E1165 Type 2 diabetes mellitus with hyperglycemia: Secondary | ICD-10-CM | POA: Diagnosis not present

## 2020-02-01 DIAGNOSIS — Z789 Other specified health status: Secondary | ICD-10-CM | POA: Diagnosis not present

## 2020-02-01 DIAGNOSIS — J189 Pneumonia, unspecified organism: Secondary | ICD-10-CM | POA: Diagnosis not present

## 2020-02-08 DIAGNOSIS — I517 Cardiomegaly: Secondary | ICD-10-CM | POA: Diagnosis not present

## 2020-02-08 DIAGNOSIS — R911 Solitary pulmonary nodule: Secondary | ICD-10-CM | POA: Diagnosis not present

## 2020-02-08 DIAGNOSIS — J189 Pneumonia, unspecified organism: Secondary | ICD-10-CM | POA: Diagnosis not present

## 2020-02-11 DIAGNOSIS — Z299 Encounter for prophylactic measures, unspecified: Secondary | ICD-10-CM | POA: Diagnosis not present

## 2020-02-11 DIAGNOSIS — I10 Essential (primary) hypertension: Secondary | ICD-10-CM | POA: Diagnosis not present

## 2020-02-11 DIAGNOSIS — R809 Proteinuria, unspecified: Secondary | ICD-10-CM | POA: Diagnosis not present

## 2020-02-11 DIAGNOSIS — E1165 Type 2 diabetes mellitus with hyperglycemia: Secondary | ICD-10-CM | POA: Diagnosis not present

## 2020-02-11 DIAGNOSIS — E1129 Type 2 diabetes mellitus with other diabetic kidney complication: Secondary | ICD-10-CM | POA: Diagnosis not present

## 2020-02-27 DIAGNOSIS — I10 Essential (primary) hypertension: Secondary | ICD-10-CM | POA: Diagnosis not present

## 2020-02-27 DIAGNOSIS — Z9049 Acquired absence of other specified parts of digestive tract: Secondary | ICD-10-CM | POA: Diagnosis not present

## 2020-02-27 DIAGNOSIS — K219 Gastro-esophageal reflux disease without esophagitis: Secondary | ICD-10-CM | POA: Diagnosis not present

## 2020-02-27 DIAGNOSIS — E119 Type 2 diabetes mellitus without complications: Secondary | ICD-10-CM | POA: Diagnosis not present

## 2020-02-27 DIAGNOSIS — R1110 Vomiting, unspecified: Secondary | ICD-10-CM | POA: Diagnosis not present

## 2020-02-27 DIAGNOSIS — J449 Chronic obstructive pulmonary disease, unspecified: Secondary | ICD-10-CM | POA: Diagnosis not present

## 2020-03-04 DIAGNOSIS — Z299 Encounter for prophylactic measures, unspecified: Secondary | ICD-10-CM | POA: Diagnosis not present

## 2020-03-04 DIAGNOSIS — R1110 Vomiting, unspecified: Secondary | ICD-10-CM | POA: Diagnosis not present

## 2020-03-04 DIAGNOSIS — E1165 Type 2 diabetes mellitus with hyperglycemia: Secondary | ICD-10-CM | POA: Diagnosis not present

## 2020-03-04 DIAGNOSIS — I10 Essential (primary) hypertension: Secondary | ICD-10-CM | POA: Diagnosis not present

## 2020-03-06 DIAGNOSIS — K219 Gastro-esophageal reflux disease without esophagitis: Secondary | ICD-10-CM | POA: Diagnosis not present

## 2020-03-06 DIAGNOSIS — Z299 Encounter for prophylactic measures, unspecified: Secondary | ICD-10-CM | POA: Diagnosis not present

## 2020-03-06 DIAGNOSIS — I10 Essential (primary) hypertension: Secondary | ICD-10-CM | POA: Diagnosis not present

## 2020-03-06 DIAGNOSIS — Z713 Dietary counseling and surveillance: Secondary | ICD-10-CM | POA: Diagnosis not present

## 2020-03-08 DIAGNOSIS — E1165 Type 2 diabetes mellitus with hyperglycemia: Secondary | ICD-10-CM | POA: Diagnosis not present

## 2020-03-08 DIAGNOSIS — E1151 Type 2 diabetes mellitus with diabetic peripheral angiopathy without gangrene: Secondary | ICD-10-CM | POA: Diagnosis not present

## 2020-03-08 DIAGNOSIS — J449 Chronic obstructive pulmonary disease, unspecified: Secondary | ICD-10-CM | POA: Diagnosis not present

## 2020-03-08 DIAGNOSIS — E1122 Type 2 diabetes mellitus with diabetic chronic kidney disease: Secondary | ICD-10-CM | POA: Diagnosis not present

## 2020-03-08 DIAGNOSIS — Z79899 Other long term (current) drug therapy: Secondary | ICD-10-CM | POA: Diagnosis not present

## 2020-03-08 DIAGNOSIS — Z800 Family history of malignant neoplasm of digestive organs: Secondary | ICD-10-CM | POA: Diagnosis not present

## 2020-03-08 DIAGNOSIS — E119 Type 2 diabetes mellitus without complications: Secondary | ICD-10-CM | POA: Diagnosis not present

## 2020-03-08 DIAGNOSIS — Z7982 Long term (current) use of aspirin: Secondary | ICD-10-CM | POA: Diagnosis not present

## 2020-03-08 DIAGNOSIS — N201 Calculus of ureter: Secondary | ICD-10-CM | POA: Diagnosis not present

## 2020-03-08 DIAGNOSIS — I10 Essential (primary) hypertension: Secondary | ICD-10-CM | POA: Diagnosis not present

## 2020-03-08 DIAGNOSIS — I129 Hypertensive chronic kidney disease with stage 1 through stage 4 chronic kidney disease, or unspecified chronic kidney disease: Secondary | ICD-10-CM | POA: Diagnosis not present

## 2020-03-08 DIAGNOSIS — R197 Diarrhea, unspecified: Secondary | ICD-10-CM | POA: Diagnosis not present

## 2020-03-08 DIAGNOSIS — K219 Gastro-esophageal reflux disease without esophagitis: Secondary | ICD-10-CM | POA: Diagnosis not present

## 2020-03-08 DIAGNOSIS — R110 Nausea: Secondary | ICD-10-CM | POA: Diagnosis not present

## 2020-03-08 DIAGNOSIS — Z96652 Presence of left artificial knee joint: Secondary | ICD-10-CM | POA: Diagnosis not present

## 2020-03-08 DIAGNOSIS — Z981 Arthrodesis status: Secondary | ICD-10-CM | POA: Diagnosis not present

## 2020-03-08 DIAGNOSIS — K760 Fatty (change of) liver, not elsewhere classified: Secondary | ICD-10-CM | POA: Diagnosis not present

## 2020-03-08 DIAGNOSIS — N179 Acute kidney failure, unspecified: Secondary | ICD-10-CM | POA: Diagnosis not present

## 2020-03-08 DIAGNOSIS — R112 Nausea with vomiting, unspecified: Secondary | ICD-10-CM | POA: Diagnosis not present

## 2020-03-08 DIAGNOSIS — R531 Weakness: Secondary | ICD-10-CM | POA: Diagnosis not present

## 2020-03-08 DIAGNOSIS — D72829 Elevated white blood cell count, unspecified: Secondary | ICD-10-CM | POA: Diagnosis not present

## 2020-03-08 DIAGNOSIS — N132 Hydronephrosis with renal and ureteral calculous obstruction: Secondary | ICD-10-CM | POA: Diagnosis not present

## 2020-03-08 DIAGNOSIS — B9689 Other specified bacterial agents as the cause of diseases classified elsewhere: Secondary | ICD-10-CM | POA: Diagnosis not present

## 2020-03-08 DIAGNOSIS — N189 Chronic kidney disease, unspecified: Secondary | ICD-10-CM | POA: Diagnosis not present

## 2020-03-08 DIAGNOSIS — Z86010 Personal history of colonic polyps: Secondary | ICD-10-CM | POA: Diagnosis not present

## 2020-03-08 DIAGNOSIS — U071 COVID-19: Secondary | ICD-10-CM | POA: Diagnosis not present

## 2020-03-08 DIAGNOSIS — Z794 Long term (current) use of insulin: Secondary | ICD-10-CM | POA: Diagnosis not present

## 2020-03-08 DIAGNOSIS — R1111 Vomiting without nausea: Secondary | ICD-10-CM | POA: Diagnosis not present

## 2020-03-08 DIAGNOSIS — Z87442 Personal history of urinary calculi: Secondary | ICD-10-CM | POA: Diagnosis not present

## 2020-03-08 DIAGNOSIS — R6883 Chills (without fever): Secondary | ICD-10-CM | POA: Diagnosis not present

## 2020-03-08 DIAGNOSIS — N200 Calculus of kidney: Secondary | ICD-10-CM | POA: Diagnosis not present

## 2020-03-14 DIAGNOSIS — I10 Essential (primary) hypertension: Secondary | ICD-10-CM | POA: Diagnosis not present

## 2020-03-14 DIAGNOSIS — E1165 Type 2 diabetes mellitus with hyperglycemia: Secondary | ICD-10-CM | POA: Diagnosis not present

## 2020-03-14 DIAGNOSIS — Z299 Encounter for prophylactic measures, unspecified: Secondary | ICD-10-CM | POA: Diagnosis not present

## 2020-03-14 DIAGNOSIS — N390 Urinary tract infection, site not specified: Secondary | ICD-10-CM | POA: Diagnosis not present

## 2020-03-18 ENCOUNTER — Ambulatory Visit: Payer: Self-pay | Admitting: Urology

## 2020-03-26 ENCOUNTER — Ambulatory Visit (INDEPENDENT_AMBULATORY_CARE_PROVIDER_SITE_OTHER): Payer: Medicare Other | Admitting: Urology

## 2020-03-26 ENCOUNTER — Other Ambulatory Visit: Payer: Self-pay

## 2020-03-26 ENCOUNTER — Encounter: Payer: Self-pay | Admitting: Urology

## 2020-03-26 VITALS — BP 126/72 | HR 101

## 2020-03-26 DIAGNOSIS — N200 Calculus of kidney: Secondary | ICD-10-CM | POA: Diagnosis not present

## 2020-03-26 LAB — MICROSCOPIC EXAMINATION
Renal Epithel, UA: NONE SEEN /hpf
WBC, UA: >30 /hpf — AB (ref 0–5)

## 2020-03-26 LAB — URINALYSIS, ROUTINE W REFLEX MICROSCOPIC
Bilirubin, UA: NEGATIVE
Ketones, UA: NEGATIVE
Nitrite, UA: NEGATIVE
Specific Gravity, UA: 1.02 (ref 1.005–1.030)
Urobilinogen, Ur: 0.2 mg/dL (ref 0.2–1.0)
pH, UA: 6 (ref 5.0–7.5)

## 2020-03-26 NOTE — Progress Notes (Signed)
Urological Symptom Review  Patient is experiencing the following symptoms: None    Review of Systems  Gastrointestinal (upper)  : Negative for upper GI symptoms  Gastrointestinal (lower)  negative  Constitutional : Negative for symptoms  Skin: Negative for skin symptoms  Eyes: Negative for eye symptoms  Ear/Nose/Throat : Negative for Ear/Nose/Throat symptoms  Hematologic/Lymphatic: Negative for Hematologic/Lymphatic symptoms  Cardiovascular : Negative for cardiovascular symptoms  Respiratory : Negative for respiratory symptoms  Endocrine: Negative for endocrine symptoms  Musculoskeletal: Negative for musculoskeletal symptoms  Neurological: Negative for neurological symptoms  Psychologic: Negative for psychiatric symptoms

## 2020-03-26 NOTE — Progress Notes (Signed)
03/26/2020 11:12 AM   Leah Duncan [birth year] [id number]  Referring provider: No referring provider defined for this encounter.  nephrolithiasis  HPI: Leah Duncan is a 64yo here for evaluation of nephrolithiasis. She presented to [facility] on 03/08/2020 with right flank pain and concern for sepsis. She was found to have an 66mm right proximal ureteral calculus. She was transferred to [facility] and underwent ureteral stent placement. She has urinary frequency. Urgency and occasional dysuria.  Her last stone event was 4 years ago.  She had previous ESWL   PMH: Past Medical History:  Diagnosis Date  . Arthritis   . COPD (chronic obstructive pulmonary disease) ([place])   . Depression   . GERD (gastroesophageal reflux disease)   . Headache(784.0)   . Hypertension   . Kidney stones 02/2013  . Shortness of breath    anxiety, exertion    Surgical History: Past Surgical History:  Procedure Laterality Date  . ABDOMINAL HYSTERECTOMY    . CHOLECYSTECTOMY    . HEMORRHOID SURGERY    . KNEE ARTHROSCOPY Bilateral   . TOTAL KNEE ARTHROPLASTY Left 2011  . TOTAL KNEE ARTHROPLASTY Left 05/15/2013   Dr Durward Fortes  . TOTAL KNEE REVISION Left 05/15/2013   Procedure: TOTAL KNEE REVISION;  Surgeon: Garald Balding, MD;  Location: [place];  Service: Orthopedics;  Laterality: Left;    Home Medications:  Allergies as of 03/26/2020   No Known Allergies     Medication List       Accurate as of March 26, 2020 11:12 AM. If you have any questions, ask your nurse or doctor.        STOP taking these medications   amitriptyline 10 MG tablet Commonly known as: ELAVIL Stopped by: [name], MD   methocarbamol 500 MG tablet Commonly known as: ROBAXIN Stopped by: [name], MD     TAKE these medications   acetaminophen 325 MG tablet Commonly known as: TYLENOL Take 650 mg by mouth every 6 (six) hours as needed.   aspirin 325 MG EC tablet Take 1 tablet by mouth 2 (two) times  daily.   buPROPion 150 MG 12 hr tablet Commonly known as: WELLBUTRIN SR Take by mouth.   CYCLOBENZAPRINE HCL PO SMARTSIG:1 Tablet(s) By Mouth Every Night   DSS 100 MG Caps Take by mouth.   ergocalciferol 1.25 MG (50000 UT) capsule Commonly known as: VITAMIN D2 TAKE 1 CAPSULE ONE TIME PER WEEK What changed: Another medication with the same name was removed. Continue taking this medication, and follow the directions you see here. Changed by: [name], MD   escitalopram 10 MG tablet Commonly known as: LEXAPRO Take by mouth.   estradiol 0.5 MG tablet Commonly known as: ESTRACE Take 1 tablet by mouth daily.   ibuprofen 800 MG tablet Commonly known as: ADVIL What changed: Another medication with the same name was removed. Continue taking this medication, and follow the directions you see here. Changed by: [name], MD   lisinopril 10 MG tablet Commonly known as: ZESTRIL Take 10 mg by mouth daily.   magnesium oxide 400 MG tablet Commonly known as: MAG-OX Take 400 mg by mouth 2 (two) times daily.   meclizine 25 MG tablet Commonly known as: ANTIVERT Take by mouth.   ondansetron 4 MG tablet Commonly known as: ZOFRAN Take 4 mg by mouth every 8 (eight) hours as needed.   oxyCODONE 5 MG immediate release tablet Commonly known as: Oxy IR/ROXICODONE Take 1-2 tablets (5-10 mg total) by mouth  every 4 (four) hours as needed for moderate pain, severe pain or breakthrough pain.   pantoprazole 40 MG tablet Commonly known as: PROTONIX Take by mouth. What changed: Another medication with the same name was removed. Continue taking this medication, and follow the directions you see here. Changed by: [name], MD   PARoxetine 10 MG tablet Commonly known as: PAXIL Take by mouth.   phentermine 37.5 MG capsule Take by mouth.   ProAir HFA 108 (90 Base) MCG/ACT inhaler Generic drug: albuterol INHALE 2 PUFFS INTO LUNGS EVERY SIX HOURS   tamsulosin 0.4 MG  Caps capsule Commonly known as: FLOMAX Take by mouth.       Allergies: No Known Allergies  Family History: No family history on file.  Social History:  reports that she has never smoked. She has never used smokeless tobacco. She reports that she does not drink alcohol and does not use drugs.  ROS: All other review of systems were reviewed and are negative except what is noted above in HPI  Physical Exam: There were no vitals taken for this visit.  Constitutional:  Alert and oriented, No acute distress. HEENT: [place] AT, moist mucus membranes.  Trachea midline, no masses. Cardiovascular: No clubbing, cyanosis, or edema. Respiratory: Normal respiratory effort, no increased work of breathing. GI: Abdomen is soft, nontender, nondistended, no abdominal masses GU: No CVA tenderness.  Lymph: No cervical or inguinal lymphadenopathy. Skin: No rashes, bruises or suspicious lesions. Neurologic: Grossly intact, no focal deficits, moving all 4 extremities. Psychiatric: Normal mood and affect.  Laboratory Data: Lab Results  Component Value Date   WBC 8.4 05/18/2013   HGB 9.4 (L) 05/18/2013   HCT 27.2 (L) 05/18/2013   MCV 88.0 05/18/2013   PLT 132 (L) 05/18/2013    Lab Results  Component Value Date   CREATININE 0.79 05/18/2013    No results found for: PSA  No results found for: TESTOSTERONE  No results found for: HGBA1C  Urinalysis    Component Value Date/Time   COLORURINE YELLOW 05/15/2013 1108   APPEARANCEUR CLOUDY (A) 05/15/2013 1108   LABSPEC 1.013 05/15/2013 1108   PHURINE 7.0 05/15/2013 1108   GLUCOSEU NEGATIVE 05/15/2013 1108   HGBUR NEGATIVE 05/15/2013 1108   BILIRUBINUR NEGATIVE 05/15/2013 1108   KETONESUR NEGATIVE 05/15/2013 1108   PROTEINUR NEGATIVE 05/15/2013 1108   UROBILINOGEN 0.2 05/15/2013 1108   NITRITE NEGATIVE 05/15/2013 1108   LEUKOCYTESUR SMALL (A) 05/15/2013 1108    Lab Results  Component Value Date   BACTERIA RARE 05/15/2013    Pertinent  Imaging: CT 03/08/2020: Images reviewed and discussed with the patient Results for orders placed during the hospital encounter of 12/26/07  DG Abd 1 View  Narrative Clinical Data: Back pain bilaterally, with hematuria.  ABDOMEN - 1 VIEW  Comparison: None  Findings: There are stones projecting over both renal outlines. The largest on the right measures 5 mm and on the left, 6 mm.  A 1.2 x 0.8 cm stone projects over the expected location of the left renal pelvis. There are no definite radiopaque calculi projecting over the course of the ureters bilaterally.  Degenerative changes are seen in the spine.  IMPRESSION:  1.  Bilateral nephrolithiasis. 2.  Left renal pelvis stone.  Provider: Briscoe Burns  No results found for this or any previous visit.  No results found for this or any previous visit.  No results found for this or any previous visit.  No results found for this or any previous visit.  No  results found for this or any previous visit.  No results found for this or any previous visit.  No results found for this or any previous visit.   Assessment & Plan:    1. Kidney stones -We discussed the management of kidney stones. These options include observation, ureteroscopy, shockwave lithotripsy (ESWL) and percutaneous nephrolithotomy (PCNL). We discussed which options are relevant to the patient's stone(s). We discussed the natural history of kidney stones as well as the complications of untreated stones and the impact on quality of life without treatment as well as with each of the above listed treatments. We also discussed the efficacy of each treatment in its ability to clear the stone burden. With any of these management options I discussed the signs and symptoms of infection and the need for emergent treatment should these be experienced. For each option we discussed the ability of each procedure to clear the patient of their stone burden.   For observation I  described the risks which include but are not limited to silent renal damage, life-threatening infection, need for emergent surgery, failure to pass stone and pain.   For ureteroscopy I described the risks which include bleeding, infection, damage to contiguous structures, positioning injury, ureteral stricture, ureteral avulsion, ureteral injury, need for prolonged ureteral stent, inability to perform ureteroscopy, need for an interval procedure, inability to clear stone burden, stent discomfort/pain, heart attack, stroke, pulmonary embolus and the inherent risks with general anesthesia.   For shockwave lithotripsy I described the risks which include arrhythmia, kidney contusion, kidney hemorrhage, need for transfusion, pain, inability to adequately break up stone, inability to pass stone fragments, Steinstrasse, infection associated with obstructing stones, need for alternate surgical procedure, need for repeat shockwave lithotripsy, MI, CVA, PE and the inherent risks with anesthesia/conscious sedation.   For PCNL I described the risks including positioning injury, pneumothorax, hydrothorax, need for chest tube, inability to clear stone burden, renal laceration, arterial venous fistula or malformation, need for embolization of kidney, loss of kidney or renal function, need for repeat procedure, need for prolonged nephrostomy tube, ureteral avulsion, MI, CVA, PE and the inherent risks of general anesthesia.   - The patient would like to proceed with right ESWL. We will send her KUB prior to scheduling ESWL - Urinalysis, Routine w reflex microscopic   No follow-ups on file.  [name], MD  [facility] [place]

## 2020-03-26 NOTE — Patient Instructions (Signed)

## 2020-03-27 ENCOUNTER — Ambulatory Visit (HOSPITAL_COMMUNITY)
Admission: RE | Admit: 2020-03-27 | Discharge: 2020-03-27 | Disposition: A | Discharge status: Home / Self Care | Payer: Medicare Other | Source: Ambulatory Visit | Attending: Urology | Admitting: Urology

## 2020-03-27 ENCOUNTER — Ambulatory Visit (INDEPENDENT_AMBULATORY_CARE_PROVIDER_SITE_OTHER): Payer: Medicare Other | Admitting: Urology

## 2020-03-27 ENCOUNTER — Encounter: Payer: Self-pay | Admitting: Urology

## 2020-03-27 VITALS — BP 148/78 | HR 94 | Temp 98.6°F | Ht 60.0 in | Wt 180.0 lb

## 2020-03-27 DIAGNOSIS — N200 Calculus of kidney: Secondary | ICD-10-CM

## 2020-03-27 DIAGNOSIS — Z981 Arthrodesis status: Secondary | ICD-10-CM | POA: Diagnosis not present

## 2020-03-27 NOTE — Patient Instructions (Signed)
Leah Duncan  03/27/2020     @PREFPERIOPPHARMACY @   Your procedure is scheduled on 04/01/2020.  Report to [facility] at 7:00 A.M.  Call this number if you have problems the morning of surgery:  [phone]   Remember:  Do not eat or drink after midnight.     Take these medicines the morning of surgery with A SIP OF WATER : Flomax, Paxil, Protonix, Lexapro, Wellbutrin and Oxycodone.  Please use your inhaler before coming to the hospital.    Do not wear jewelry, make-up or nail polish.  Do not wear lotions, powders, or perfumes, or deodorant.  Do not shave 48 hours prior to surgery.  Men may shave face and neck.  Do not bring valuables to the hospital.  [facility] is not responsible for any belongings or valuables.  Contacts, dentures or bridgework may not be worn into surgery.  Leave your suitcase in the car.  After surgery it may be brought to your room.  For patients admitted to the hospital, discharge time will be determined by your treatment team.  Patients discharged the day of surgery will not be allowed to drive home.   Name and phone number of your driver:   family Special instructions:  N/A  Please read over the following fact sheets that you were given. Care and Recovery After Surgery    Lithotripsy  Lithotripsy is a treatment that can sometimes help eliminate kidney stones and the pain that they cause. A form of lithotripsy, also known as extracorporeal shock wave lithotripsy, is a nonsurgical procedure that crushes a kidney stone with shock waves. These shock waves pass through your body and focus on the kidney stone. They cause the kidney stone to break up while it is still in the urinary tract. This makes it easier for the smaller pieces of stone to pass in the urine. Tell a health care provider about:  Any allergies you have.  All medicines you are taking, including vitamins, herbs, eye drops, creams, and over-the-counter medicines.  Any blood  disorders you have.  Any surgeries you have had.  Any medical conditions you have.  Whether you are pregnant or may be pregnant.  Any problems you or family members have had with anesthetic medicines. What are the risks? Generally, this is a safe procedure. However, problems may occur, including:  Infection.  Bleeding of the kidney.  Bruising of the kidney or skin.  Scarring of the kidney, which can lead to: ? Increased blood pressure. ? Poor kidney function. ? Return (recurrence) of kidney stones.  Damage to other structures or organs, such as the liver, colon, spleen, or pancreas.  Blockage (obstruction) of the the tube that carries urine from the kidney to the bladder (ureter).  Failure of the kidney stone to break into pieces (fragments). What happens before the procedure? Staying hydrated Follow instructions from your health care provider about hydration, which may include:  Up to 2 hours before the procedure - you may continue to drink clear liquids, such as water, clear fruit juice, black coffee, and plain tea. Eating and drinking restrictions Follow instructions from your health care provider about eating and drinking, which may include:  8 hours before the procedure - stop eating heavy meals or foods such as meat, fried foods, or fatty foods.  6 hours before the procedure - stop eating light meals or foods, such as toast or cereal.  6 hours before the procedure - stop drinking milk or drinks that  contain milk.  2 hours before the procedure - stop drinking clear liquids. General instructions  Plan to have someone take you home from the hospital or clinic.  Ask your health care provider about: ? Changing or stopping your regular medicines. This is especially important if you are taking diabetes medicines or blood thinners. ? Taking medicines such as aspirin and ibuprofen. These medicines and other NSAIDs can thin your blood. Do not take these medicines for 7  days before your procedure if your health care provider instructs you not to.  You may have tests, such as: ? Blood tests. ? Urine tests. ? Imaging tests, such as a CT scan. What happens during the procedure?  To lower your risk of infection: ? Your health care team will wash or sanitize their hands. ? Your skin will be washed with soap.  An IV tube will be inserted into one of your veins. This tube will give you fluids and medicines.  You will be given one or more of the following: ? A medicine to help you relax (sedative). ? A medicine to make you fall asleep (general anesthetic).  A water-filled cushion may be placed behind your kidney or on your abdomen. In some cases you may be placed in a tub of lukewarm water.  Your body will be positioned in a way that makes it easy to target the kidney stone.  A flexible tube with holes in it (stent) may be placed in the ureter. This will help keep urine flowing from the kidney if the fragments of the stone have been blocking the ureter.  An X-ray or ultrasound exam will be done to locate your stone.  Shock waves will be aimed at the stone. If you are awake, you may feel a tapping sensation as the shock waves pass through your body. The procedure may vary among health care providers and hospitals. What happens after the procedure?  You may have an X-ray to see whether the procedure was able to break up the kidney stone and how much of the stone has passed. If large stone fragments remain after treatment, you may need to have a second procedure at a later time.  Your blood pressure, heart rate, breathing rate, and blood oxygen level will be monitored until the medicines you were given have worn off.  You may be given antibiotics or pain medicine as needed.  If a stent was placed in your ureter during surgery, it may stay in place for a few weeks.  You may need strain your urine to collect pieces of the kidney stone for testing.  You  will need to drink plenty of water.  Do not drive for 24 hours if you were given a sedative. Summary  Lithotripsy is a treatment that can sometimes help eliminate kidney stones and the pain that they cause.  A form of lithotripsy, also known as extracorporeal shock wave lithotripsy, is a nonsurgical procedure that crushes a kidney stone with shock waves.  Generally, this is a safe procedure. However, problems may occur, including damage to the kidney or other organs, infection, or obstruction of the tube that carries urine from the kidney to the bladder (ureter).  When you go home, you will need to drink plenty of water. You may be asked to strain your urine to collect pieces of the kidney stone for testing. This information is not intended to replace advice given to you by your health care provider. Make sure you discuss any questions you  have with your health care provider. Document Revised: 09/11/2018 Document Reviewed: 04/21/2016 Elsevier Patient Education  2020 [employer].

## 2020-03-27 NOTE — H&P (View-Only) (Signed)
03/27/2020 10:31 AM   Leah Duncan 1955-07-24 [id number]  Referring provider: Monico Blitz, MD [address] [place],  [place] [address]  Nephrolithiasis  HPI: Leah Duncan is a 63yo here for followup for nephrolithiasis. She had a KUB today which showed bilateral renal calculi and a 35mm right UPJ calculus with stent in place. No flank pain. No significant LUTS   PMH: Past Medical History:  Diagnosis Date   Arthritis    COPD (chronic obstructive pulmonary disease) (HCC)    Depression    GERD (gastroesophageal reflux disease)    Headache(784.0)    Hypertension    Kidney stones 02/2013   Shortness of breath    anxiety, exertion    Surgical History: Past Surgical History:  Procedure Laterality Date   ABDOMINAL HYSTERECTOMY     CHOLECYSTECTOMY     HEMORRHOID SURGERY     KNEE ARTHROSCOPY Bilateral    TOTAL KNEE ARTHROPLASTY Left 2011   TOTAL KNEE ARTHROPLASTY Left 05/15/2013   Dr Durward Fortes   TOTAL KNEE REVISION Left 05/15/2013   Procedure: TOTAL KNEE REVISION;  Surgeon: Garald Balding, MD;  Location: [facility];  Service: Orthopedics;  Laterality: Left;    Home Medications:  Allergies as of 03/27/2020   No Known Allergies     Medication List       Accurate as of March 27, 2020 10:31 AM. If you have any questions, ask your nurse or doctor.        acetaminophen 325 MG tablet Commonly known as: TYLENOL Take 650 mg by mouth every 6 (six) hours as needed.   aspirin 325 MG EC tablet Take 1 tablet by mouth 2 (two) times daily.   buPROPion 150 MG 12 hr tablet Commonly known as: WELLBUTRIN SR Take by mouth.   CYCLOBENZAPRINE HCL PO SMARTSIG:1 Tablet(s) By Mouth Every Night   DSS 100 MG Caps Take by mouth.   ergocalciferol 1.25 MG (50000 UT) capsule Commonly known as: VITAMIN D2 TAKE 1 CAPSULE ONE TIME PER WEEK   escitalopram 10 MG tablet Commonly known as: LEXAPRO Take by mouth.   estradiol 0.5 MG tablet Commonly known as: ESTRACE Take 1  tablet by mouth daily.   ibuprofen 800 MG tablet Commonly known as: ADVIL   lisinopril 10 MG tablet Commonly known as: ZESTRIL Take 10 mg by mouth daily.   magnesium oxide 400 MG tablet Commonly known as: MAG-OX Take 400 mg by mouth 2 (two) times daily.   meclizine 25 MG tablet Commonly known as: ANTIVERT Take by mouth.   ondansetron 4 MG tablet Commonly known as: ZOFRAN Take 4 mg by mouth every 8 (eight) hours as needed.   oxyCODONE 5 MG immediate release tablet Commonly known as: Oxy IR/ROXICODONE Take 1-2 tablets (5-10 mg total) by mouth every 4 (four) hours as needed for moderate pain, severe pain or breakthrough pain.   pantoprazole 40 MG tablet Commonly known as: PROTONIX Take by mouth.   PARoxetine 10 MG tablet Commonly known as: PAXIL Take by mouth.   phentermine 37.5 MG capsule Take by mouth.   ProAir HFA 108 (90 Base) MCG/ACT inhaler Generic drug: albuterol INHALE 2 PUFFS INTO LUNGS EVERY SIX HOURS   tamsulosin 0.4 MG Caps capsule Commonly known as: FLOMAX Take by mouth.       Allergies: No Known Allergies  Family History: No family history on file.  Social History:  reports that she has never smoked. She has never used smokeless tobacco. She reports that she does not drink  alcohol and does not use drugs.  ROS: All other review of systems were reviewed and are negative except what is noted above in HPI  Physical Exam: BP (!) 148/78    Pulse 94    Temp 98.6 F (37 C)    Ht 5' (1.524 m)    Wt 180 lb (81.6 kg)    BMI 35.15 kg/m   Constitutional:  Alert and oriented, No acute distress. HEENT: [place] AT, moist mucus membranes.  Trachea midline, no masses. Cardiovascular: No clubbing, cyanosis, or edema. Respiratory: Normal respiratory effort, no increased work of breathing. GI: Abdomen is soft, nontender, nondistended, no abdominal masses GU: No CVA tenderness.  Lymph: No cervical or inguinal lymphadenopathy. Skin: No rashes, bruises or suspicious  lesions. Neurologic: Grossly intact, no focal deficits, moving all 4 extremities. Psychiatric: Normal mood and affect.  Laboratory Data: Lab Results  Component Value Date   WBC 8.4 05/18/2013   HGB 9.4 (L) 05/18/2013   HCT 27.2 (L) 05/18/2013   MCV 88.0 05/18/2013   PLT 132 (L) 05/18/2013    Lab Results  Component Value Date   CREATININE 0.79 05/18/2013    No results found for: PSA  No results found for: TESTOSTERONE  No results found for: HGBA1C  Urinalysis    Component Value Date/Time   COLORURINE YELLOW 05/15/2013 1108   APPEARANCEUR Cloudy (A) 03/26/2020 1112   LABSPEC 1.013 05/15/2013 1108   PHURINE 7.0 05/15/2013 1108   GLUCOSEU Trace (A) 03/26/2020 1112   HGBUR NEGATIVE 05/15/2013 1108   BILIRUBINUR Negative 03/26/2020 1112   KETONESUR NEGATIVE 05/15/2013 1108   PROTEINUR 2+ (A) 03/26/2020 1112   PROTEINUR NEGATIVE 05/15/2013 1108   UROBILINOGEN 0.2 05/15/2013 1108   NITRITE Negative 03/26/2020 1112   NITRITE NEGATIVE 05/15/2013 1108   LEUKOCYTESUR 1+ (A) 03/26/2020 1112    Lab Results  Component Value Date   LABMICR See below: 03/26/2020   WBCUA >30 (A) 03/26/2020   LABEPIT 0-10 03/26/2020   MUCUS Present 03/26/2020   BACTERIA Moderate (A) 03/26/2020    Pertinent Imaging: KUB today: Images reviewed and discussed with the patient Results for orders placed during the hospital encounter of 12/26/07  DG Abd 1 View  Narrative Clinical Data: Back pain bilaterally, with hematuria.  ABDOMEN - 1 VIEW  Comparison: None  Findings: There are stones projecting over both renal outlines. The largest on the right measures 5 mm and on the left, 6 mm.  A 1.2 x 0.8 cm stone projects over the expected location of the left renal pelvis. There are no definite radiopaque calculi projecting over the course of the ureters bilaterally.  Degenerative changes are seen in the spine.  IMPRESSION:  1.  Bilateral nephrolithiasis. 2.  Left renal pelvis  stone.  Provider: Briscoe Burns  No results found for this or any previous visit.  No results found for this or any previous visit.  No results found for this or any previous visit.  No results found for this or any previous visit.  No results found for this or any previous visit.  No results found for this or any previous visit.  No results found for this or any previous visit.   Assessment & Plan:    1. Kidney stones -We discussed the management of kidney stones. These options include observation, ureteroscopy, shockwave lithotripsy (ESWL) and percutaneous nephrolithotomy (PCNL). We discussed which options are relevant to the patient's stone(s). We discussed the natural history of kidney stones as well as the complications of untreated  stones and the impact on quality of life without treatment as well as with each of the above listed treatments. We also discussed the efficacy of each treatment in its ability to clear the stone burden. With any of these management options I discussed the signs and symptoms of infection and the need for emergent treatment should these be experienced. For each option we discussed the ability of each procedure to clear the patient of their stone burden.   For observation I described the risks which include but are not limited to silent renal damage, life-threatening infection, need for emergent surgery, failure to pass stone and pain.   For ureteroscopy I described the risks which include bleeding, infection, damage to contiguous structures, positioning injury, ureteral stricture, ureteral avulsion, ureteral injury, need for prolonged ureteral stent, inability to perform ureteroscopy, need for an interval procedure, inability to clear stone burden, stent discomfort/pain, heart attack, stroke, pulmonary embolus and the inherent risks with general anesthesia.   For shockwave lithotripsy I described the risks which include arrhythmia, kidney contusion, kidney  hemorrhage, need for transfusion, pain, inability to adequately break up stone, inability to pass stone fragments, Steinstrasse, infection associated with obstructing stones, need for alternate surgical procedure, need for repeat shockwave lithotripsy, MI, CVA, PE and the inherent risks with anesthesia/conscious sedation.   For PCNL I described the risks including positioning injury, pneumothorax, hydrothorax, need for chest tube, inability to clear stone burden, renal laceration, arterial venous fistula or malformation, need for embolization of kidney, loss of kidney or renal function, need for repeat procedure, need for prolonged nephrostomy tube, ureteral avulsion, MI, CVA, PE and the inherent risks of general anesthesia.   - The patient would like to proceed with Right ESWL   No follow-ups on file.  [name], MD  [facility] [place]

## 2020-03-27 NOTE — Progress Notes (Signed)
Urological Symptom Review  Patient is experiencing the following symptoms: Kidney stone  Review of Systems  Gastrointestinal (upper)  : Negative for upper GI symptoms  Gastrointestinal (lower) : Negative for lower GI symptoms  Constitutional : Negative for symptoms  Skin: Negative for skin symptoms  Eyes: Negative for eye symptoms  Ear/Nose/Throat : Negative for Ear/Nose/Throat symptoms  Hematologic/Lymphatic: Negative for Hematologic/Lymphatic symptoms  Cardiovascular : Negative for cardiovascular symptoms  Respiratory : Negative for respiratory symptoms  Endocrine: Negative for endocrine symptoms  Musculoskeletal: Negative for musculoskeletal symptoms  Neurological: Negative for neurological symptoms  Psychologic: Negative for psychiatric symptoms  

## 2020-03-27 NOTE — Patient Instructions (Signed)
Lithotripsy  Lithotripsy is a treatment that can sometimes help eliminate kidney stones and the pain that they cause. A form of lithotripsy, also known as extracorporeal shock wave lithotripsy, is a nonsurgical procedure that crushes a kidney stone with shock waves. These shock waves pass through your body and focus on the kidney stone. They cause the kidney stone to break up while it is still in the urinary tract. This makes it easier for the smaller pieces of stone to pass in the urine. Tell a health care provider about:  Any allergies you have.  All medicines you are taking, including vitamins, herbs, eye drops, creams, and over-the-counter medicines.  Any blood disorders you have.  Any surgeries you have had.  Any medical conditions you have.  Whether you are pregnant or may be pregnant.  Any problems you or family members have had with anesthetic medicines. What are the risks? Generally, this is a safe procedure. However, problems may occur, including:  Infection.  Bleeding of the kidney.  Bruising of the kidney or skin.  Scarring of the kidney, which can lead to: ? Increased blood pressure. ? Poor kidney function. ? Return (recurrence) of kidney stones.  Damage to other structures or organs, such as the liver, colon, spleen, or pancreas.  Blockage (obstruction) of the the tube that carries urine from the kidney to the bladder (ureter).  Failure of the kidney stone to break into pieces (fragments). What happens before the procedure? Staying hydrated Follow instructions from your health care provider about hydration, which may include:  Up to 2 hours before the procedure - you may continue to drink clear liquids, such as water, clear fruit juice, black coffee, and plain tea. Eating and drinking restrictions Follow instructions from your health care provider about eating and drinking, which may include:  8 hours before the procedure - stop eating heavy meals or foods  such as meat, fried foods, or fatty foods.  6 hours before the procedure - stop eating light meals or foods, such as toast or cereal.  6 hours before the procedure - stop drinking milk or drinks that contain milk.  2 hours before the procedure - stop drinking clear liquids. General instructions  Plan to have someone take you home from the hospital or clinic.  Ask your health care provider about: ? Changing or stopping your regular medicines. This is especially important if you are taking diabetes medicines or blood thinners. ? Taking medicines such as aspirin and ibuprofen. These medicines and other NSAIDs can thin your blood. Do not take these medicines for 7 days before your procedure if your health care provider instructs you not to.  You may have tests, such as: ? Blood tests. ? Urine tests. ? Imaging tests, such as a CT scan. What happens during the procedure?  To lower your risk of infection: ? Your health care team will wash or sanitize their hands. ? Your skin will be washed with soap.  An IV tube will be inserted into one of your veins. This tube will give you fluids and medicines.  You will be given one or more of the following: ? A medicine to help you relax (sedative). ? A medicine to make you fall asleep (general anesthetic).  A water-filled cushion may be placed behind your kidney or on your abdomen. In some cases you may be placed in a tub of lukewarm water.  Your body will be positioned in a way that makes it easy to target the kidney   stone.  A flexible tube with holes in it (stent) may be placed in the ureter. This will help keep urine flowing from the kidney if the fragments of the stone have been blocking the ureter.  An X-ray or ultrasound exam will be done to locate your stone.  Shock waves will be aimed at the stone. If you are awake, you may feel a tapping sensation as the shock waves pass through your body. The procedure may vary among health care  providers and hospitals. What happens after the procedure?  You may have an X-ray to see whether the procedure was able to break up the kidney stone and how much of the stone has passed. If large stone fragments remain after treatment, you may need to have a second procedure at a later time.  Your blood pressure, heart rate, breathing rate, and blood oxygen level will be monitored until the medicines you were given have worn off.  You may be given antibiotics or pain medicine as needed.  If a stent was placed in your ureter during surgery, it may stay in place for a few weeks.  You may need strain your urine to collect pieces of the kidney stone for testing.  You will need to drink plenty of water.  Do not drive for 24 hours if you were given a sedative. Summary  Lithotripsy is a treatment that can sometimes help eliminate kidney stones and the pain that they cause.  A form of lithotripsy, also known as extracorporeal shock wave lithotripsy, is a nonsurgical procedure that crushes a kidney stone with shock waves.  Generally, this is a safe procedure. However, problems may occur, including damage to the kidney or other organs, infection, or obstruction of the tube that carries urine from the kidney to the bladder (ureter).  When you go home, you will need to drink plenty of water. You may be asked to strain your urine to collect pieces of the kidney stone for testing. This information is not intended to replace advice given to you by your health care provider. Make sure you discuss any questions you have with your health care provider. Document Revised: 09/11/2018 Document Reviewed: 04/21/2016 Elsevier Patient Education  2020 [employer]  

## 2020-03-27 NOTE — Progress Notes (Signed)
03/27/2020 10:31 AM   Orlena Sheldon [birth year] [id number]  Referring provider: Monico Blitz, MD [address] [place],  [place] [address]  Nephrolithiasis  HPI: Ms Albano is a 64yo here for followup for nephrolithiasis. She had a KUB today which showed bilateral renal calculi and a 58mm right UPJ calculus with stent in place. No flank pain. No significant LUTS   PMH: Past Medical History:  Diagnosis Date  . Arthritis   . COPD (chronic obstructive pulmonary disease) ([place])   . Depression   . GERD (gastroesophageal reflux disease)   . Headache(784.0)   . Hypertension   . Kidney stones 02/2013  . Shortness of breath    anxiety, exertion    Surgical History: Past Surgical History:  Procedure Laterality Date  . ABDOMINAL HYSTERECTOMY    . CHOLECYSTECTOMY    . HEMORRHOID SURGERY    . KNEE ARTHROSCOPY Bilateral   . TOTAL KNEE ARTHROPLASTY Left 2011  . TOTAL KNEE ARTHROPLASTY Left 05/15/2013   Dr Durward Fortes  . TOTAL KNEE REVISION Left 05/15/2013   Procedure: TOTAL KNEE REVISION;  Surgeon: Garald Balding, MD;  Location: [facility];  Service: Orthopedics;  Laterality: Left;    Home Medications:  Allergies as of 03/27/2020   No Known Allergies     Medication List       Accurate as of March 27, 2020 10:31 AM. If you have any questions, ask your nurse or doctor.        acetaminophen 325 MG tablet Commonly known as: TYLENOL Take 650 mg by mouth every 6 (six) hours as needed.   aspirin 325 MG EC tablet Take 1 tablet by mouth 2 (two) times daily.   buPROPion 150 MG 12 hr tablet Commonly known as: WELLBUTRIN SR Take by mouth.   CYCLOBENZAPRINE HCL PO SMARTSIG:1 Tablet(s) By Mouth Every Night   DSS 100 MG Caps Take by mouth.   ergocalciferol 1.25 MG (50000 UT) capsule Commonly known as: VITAMIN D2 TAKE 1 CAPSULE ONE TIME PER WEEK   escitalopram 10 MG tablet Commonly known as: LEXAPRO Take by mouth.   estradiol 0.5 MG tablet Commonly known as: ESTRACE Take 1  tablet by mouth daily.   ibuprofen 800 MG tablet Commonly known as: ADVIL   lisinopril 10 MG tablet Commonly known as: ZESTRIL Take 10 mg by mouth daily.   magnesium oxide 400 MG tablet Commonly known as: MAG-OX Take 400 mg by mouth 2 (two) times daily.   meclizine 25 MG tablet Commonly known as: ANTIVERT Take by mouth.   ondansetron 4 MG tablet Commonly known as: ZOFRAN Take 4 mg by mouth every 8 (eight) hours as needed.   oxyCODONE 5 MG immediate release tablet Commonly known as: Oxy IR/ROXICODONE Take 1-2 tablets (5-10 mg total) by mouth every 4 (four) hours as needed for moderate pain, severe pain or breakthrough pain.   pantoprazole 40 MG tablet Commonly known as: PROTONIX Take by mouth.   PARoxetine 10 MG tablet Commonly known as: PAXIL Take by mouth.   phentermine 37.5 MG capsule Take by mouth.   ProAir HFA 108 (90 Base) MCG/ACT inhaler Generic drug: albuterol INHALE 2 PUFFS INTO LUNGS EVERY SIX HOURS   tamsulosin 0.4 MG Caps capsule Commonly known as: FLOMAX Take by mouth.       Allergies: No Known Allergies  Family History: No family history on file.  Social History:  reports that she has never smoked. She has never used smokeless tobacco. She reports that she does not drink  alcohol and does not use drugs.  ROS: All other review of systems were reviewed and are negative except what is noted above in HPI  Physical Exam: BP (!) 148/78   Pulse 94   Temp 98.6 F (37 C)   Ht 5' (1.524 m)   Wt 180 lb (81.6 kg)   BMI 35.15 kg/m   Constitutional:  Alert and oriented, No acute distress. HEENT: [place] AT, moist mucus membranes.  Trachea midline, no masses. Cardiovascular: No clubbing, cyanosis, or edema. Respiratory: Normal respiratory effort, no increased work of breathing. GI: Abdomen is soft, nontender, nondistended, no abdominal masses GU: No CVA tenderness.  Lymph: No cervical or inguinal lymphadenopathy. Skin: No rashes, bruises or suspicious  lesions. Neurologic: Grossly intact, no focal deficits, moving all 4 extremities. Psychiatric: Normal mood and affect.  Laboratory Data: Lab Results  Component Value Date   WBC 8.4 05/18/2013   HGB 9.4 (L) 05/18/2013   HCT 27.2 (L) 05/18/2013   MCV 88.0 05/18/2013   PLT 132 (L) 05/18/2013    Lab Results  Component Value Date   CREATININE 0.79 05/18/2013    No results found for: PSA  No results found for: TESTOSTERONE  No results found for: HGBA1C  Urinalysis    Component Value Date/Time   COLORURINE YELLOW 05/15/2013 1108   APPEARANCEUR Cloudy (A) 03/26/2020 1112   LABSPEC 1.013 05/15/2013 1108   PHURINE 7.0 05/15/2013 1108   GLUCOSEU Trace (A) 03/26/2020 1112   HGBUR NEGATIVE 05/15/2013 1108   BILIRUBINUR Negative 03/26/2020 1112   KETONESUR NEGATIVE 05/15/2013 1108   PROTEINUR 2+ (A) 03/26/2020 1112   PROTEINUR NEGATIVE 05/15/2013 1108   UROBILINOGEN 0.2 05/15/2013 1108   NITRITE Negative 03/26/2020 1112   NITRITE NEGATIVE 05/15/2013 1108   LEUKOCYTESUR 1+ (A) 03/26/2020 1112    Lab Results  Component Value Date   LABMICR See below: 03/26/2020   WBCUA >30 (A) 03/26/2020   LABEPIT 0-10 03/26/2020   MUCUS Present 03/26/2020   BACTERIA Moderate (A) 03/26/2020    Pertinent Imaging: KUB today: Images reviewed and discussed with the patient Results for orders placed during the hospital encounter of 12/26/07  DG Abd 1 View  Narrative Clinical Data: Back pain bilaterally, with hematuria.  ABDOMEN - 1 VIEW  Comparison: None  Findings: There are stones projecting over both renal outlines. The largest on the right measures 5 mm and on the left, 6 mm.  A 1.2 x 0.8 cm stone projects over the expected location of the left renal pelvis. There are no definite radiopaque calculi projecting over the course of the ureters bilaterally.  Degenerative changes are seen in the spine.  IMPRESSION:  1.  Bilateral nephrolithiasis. 2.  Left renal pelvis  stone.  Provider: Briscoe Burns  No results found for this or any previous visit.  No results found for this or any previous visit.  No results found for this or any previous visit.  No results found for this or any previous visit.  No results found for this or any previous visit.  No results found for this or any previous visit.  No results found for this or any previous visit.   Assessment & Plan:    1. Kidney stones -We discussed the management of kidney stones. These options include observation, ureteroscopy, shockwave lithotripsy (ESWL) and percutaneous nephrolithotomy (PCNL). We discussed which options are relevant to the patient's stone(s). We discussed the natural history of kidney stones as well as the complications of untreated stones and the impact on  quality of life without treatment as well as with each of the above listed treatments. We also discussed the efficacy of each treatment in its ability to clear the stone burden. With any of these management options I discussed the signs and symptoms of infection and the need for emergent treatment should these be experienced. For each option we discussed the ability of each procedure to clear the patient of their stone burden.   For observation I described the risks which include but are not limited to silent renal damage, life-threatening infection, need for emergent surgery, failure to pass stone and pain.   For ureteroscopy I described the risks which include bleeding, infection, damage to contiguous structures, positioning injury, ureteral stricture, ureteral avulsion, ureteral injury, need for prolonged ureteral stent, inability to perform ureteroscopy, need for an interval procedure, inability to clear stone burden, stent discomfort/pain, heart attack, stroke, pulmonary embolus and the inherent risks with general anesthesia.   For shockwave lithotripsy I described the risks which include arrhythmia, kidney contusion, kidney  hemorrhage, need for transfusion, pain, inability to adequately break up stone, inability to pass stone fragments, Steinstrasse, infection associated with obstructing stones, need for alternate surgical procedure, need for repeat shockwave lithotripsy, MI, CVA, PE and the inherent risks with anesthesia/conscious sedation.   For PCNL I described the risks including positioning injury, pneumothorax, hydrothorax, need for chest tube, inability to clear stone burden, renal laceration, arterial venous fistula or malformation, need for embolization of kidney, loss of kidney or renal function, need for repeat procedure, need for prolonged nephrostomy tube, ureteral avulsion, MI, CVA, PE and the inherent risks of general anesthesia.   - The patient would like to proceed with Right ESWL   No follow-ups on file.  [name], MD  [facility]

## 2020-03-28 ENCOUNTER — Encounter (HOSPITAL_COMMUNITY)
Admission: RE | Admit: 2020-03-28 | Discharge: 2020-03-28 | Disposition: A | Discharge status: Home / Self Care | Payer: Medicare Other | Source: Ambulatory Visit | Attending: Urology | Admitting: Urology

## 2020-03-28 ENCOUNTER — Other Ambulatory Visit: Payer: Self-pay

## 2020-03-28 ENCOUNTER — Encounter (HOSPITAL_COMMUNITY): Payer: Self-pay

## 2020-03-31 ENCOUNTER — Other Ambulatory Visit: Payer: Self-pay

## 2020-03-31 ENCOUNTER — Other Ambulatory Visit (HOSPITAL_COMMUNITY)
Admission: RE | Admit: 2020-03-31 | Discharge: 2020-03-31 | Disposition: A | Discharge status: Home / Self Care | Payer: Medicare Other | Source: Ambulatory Visit | Attending: Urology | Admitting: Urology

## 2020-03-31 DIAGNOSIS — Z20822 Contact with and (suspected) exposure to covid-19: Secondary | ICD-10-CM | POA: Diagnosis not present

## 2020-03-31 DIAGNOSIS — Z01812 Encounter for preprocedural laboratory examination: Secondary | ICD-10-CM | POA: Insufficient documentation

## 2020-03-31 LAB — SARS CORONAVIRUS 2 (TAT 6-24 HRS): SARS Coronavirus 2: NEGATIVE

## 2020-04-01 ENCOUNTER — Encounter (HOSPITAL_COMMUNITY)
Admission: RE | Disposition: A | Discharge status: Home / Self Care | Payer: Self-pay | Source: Home / Self Care | Attending: Urology

## 2020-04-01 ENCOUNTER — Ambulatory Visit (HOSPITAL_COMMUNITY)
Admission: RE | Admit: 2020-04-01 | Discharge: 2020-04-01 | Disposition: A | Discharge status: Home / Self Care | Payer: Medicare Other | Attending: Urology | Admitting: Urology

## 2020-04-01 ENCOUNTER — Ambulatory Visit (HOSPITAL_COMMUNITY): Payer: Medicare Other

## 2020-04-01 DIAGNOSIS — Z6835 Body mass index (BMI) 35.0-35.9, adult: Secondary | ICD-10-CM | POA: Diagnosis not present

## 2020-04-01 DIAGNOSIS — Z7982 Long term (current) use of aspirin: Secondary | ICD-10-CM | POA: Insufficient documentation

## 2020-04-01 DIAGNOSIS — K219 Gastro-esophageal reflux disease without esophagitis: Secondary | ICD-10-CM | POA: Insufficient documentation

## 2020-04-01 DIAGNOSIS — J449 Chronic obstructive pulmonary disease, unspecified: Secondary | ICD-10-CM | POA: Diagnosis not present

## 2020-04-01 DIAGNOSIS — F32A Depression, unspecified: Secondary | ICD-10-CM | POA: Diagnosis not present

## 2020-04-01 DIAGNOSIS — Z981 Arthrodesis status: Secondary | ICD-10-CM | POA: Diagnosis not present

## 2020-04-01 DIAGNOSIS — Z79890 Hormone replacement therapy (postmenopausal): Secondary | ICD-10-CM | POA: Diagnosis not present

## 2020-04-01 DIAGNOSIS — M47816 Spondylosis without myelopathy or radiculopathy, lumbar region: Secondary | ICD-10-CM | POA: Diagnosis not present

## 2020-04-01 DIAGNOSIS — Z79899 Other long term (current) drug therapy: Secondary | ICD-10-CM | POA: Insufficient documentation

## 2020-04-01 DIAGNOSIS — N200 Calculus of kidney: Secondary | ICD-10-CM | POA: Diagnosis not present

## 2020-04-01 DIAGNOSIS — Z96652 Presence of left artificial knee joint: Secondary | ICD-10-CM | POA: Diagnosis not present

## 2020-04-01 DIAGNOSIS — J45909 Unspecified asthma, uncomplicated: Secondary | ICD-10-CM | POA: Insufficient documentation

## 2020-04-01 DIAGNOSIS — I10 Essential (primary) hypertension: Secondary | ICD-10-CM | POA: Insufficient documentation

## 2020-04-01 DIAGNOSIS — N201 Calculus of ureter: Secondary | ICD-10-CM | POA: Insufficient documentation

## 2020-04-01 DIAGNOSIS — Z87442 Personal history of urinary calculi: Secondary | ICD-10-CM | POA: Diagnosis not present

## 2020-04-01 DIAGNOSIS — E669 Obesity, unspecified: Secondary | ICD-10-CM | POA: Diagnosis not present

## 2020-04-01 HISTORY — PX: EXTRACORPOREAL SHOCK WAVE LITHOTRIPSY: SHX1557

## 2020-04-01 SURGERY — LITHOTRIPSY, ESWL
Anesthesia: LOCAL | Laterality: Right

## 2020-04-01 MED ORDER — ONDANSETRON HCL 4 MG PO TABS: 4.0000 mg | ORAL_TABLET | Freq: Every day | ORAL | 1 refills | Status: DC | PRN

## 2020-04-01 MED ORDER — DIPHENHYDRAMINE HCL 25 MG PO CAPS
ORAL_CAPSULE | ORAL | Status: AC
  Filled 2020-04-01: qty 1

## 2020-04-01 MED ORDER — SODIUM CHLORIDE 0.9 % IV SOLN: INTRAVENOUS | Status: DC

## 2020-04-01 MED ORDER — TAMSULOSIN HCL 0.4 MG PO CAPS: 0.4000 mg | ORAL_CAPSULE | Freq: Every day | ORAL | 0 refills | Status: AC

## 2020-04-01 MED ORDER — DIAZEPAM 5 MG PO TABS
10.0000 mg | ORAL_TABLET | Freq: Once | ORAL | Status: AC
  Administered 2020-04-01: 10 mg via ORAL
  Filled 2020-04-01: qty 2

## 2020-04-01 MED ORDER — DIPHENHYDRAMINE HCL 25 MG PO CAPS
25.0000 mg | ORAL_CAPSULE | ORAL | Status: AC
  Administered 2020-04-01: 25 mg via ORAL
  Filled 2020-04-01: qty 1

## 2020-04-01 MED ORDER — DIAZEPAM 5 MG PO TABS
ORAL_TABLET | ORAL | Status: AC
  Filled 2020-04-01: qty 2

## 2020-04-01 MED ORDER — OXYCODONE HCL 5 MG PO TABS: 5.0000 mg | ORAL_TABLET | ORAL | 0 refills | Status: DC | PRN

## 2020-04-01 NOTE — Interval H&P Note (Signed)
History and Physical Interval Note:  04/01/2020 8:31 AM  Leah Duncan  has presented today for surgery, with the diagnosis of right ureteral calculus.  The various methods of treatment have been discussed with the patient and family. After consideration of risks, benefits and other options for treatment, the patient has consented to  Procedure(s): EXTRACORPOREAL SHOCK WAVE LITHOTRIPSY (ESWL) (Right) as a surgical intervention.  The patient's history has been reviewed, patient examined, no change in status, stable for surgery.  I have reviewed the patient's chart and labs.  Questions were answered to the patient's satisfaction.     [name]

## 2020-04-01 NOTE — Discharge Instructions (Signed)
Lithotripsy, Care After This sheet gives you information about how to care for yourself after your procedure. Your health care provider may also give you more specific instructions. If you have problems or questions, contact your health care provider. What can I expect after the procedure? After the procedure, it is common to have:  Some blood in your urine. This should only last for a few days.  Soreness in your back, sides, or upper abdomen for a few days.  Blotches or bruises on your back where the pressure wave entered the skin.  Pain, discomfort, or nausea when pieces (fragments) of the kidney stone move through the tube that carries urine from the kidney to the bladder (ureter). Stone fragments may pass soon after the procedure, but they may continue to pass for up to 4-8 weeks. ? If you have severe pain or nausea, contact your health care provider. This may be caused by a large stone that was not broken up, and this may mean that you need more treatment.  Some pain or discomfort during urination.  Some pain or discomfort in the lower abdomen or (in men) at the base of the penis. Follow these instructions at home: Medicines  Take over-the-counter and prescription medicines only as told by your health care provider.  If you were prescribed an antibiotic medicine, take it as told by your health care provider. Do not stop taking the antibiotic even if you start to feel better.  Do not drive for 24 hours if you were given a medicine to help you relax (sedative).  Do not drive or use heavy machinery while taking prescription pain medicine. Eating and drinking      Drink enough water and fluids to keep your urine clear or pale yellow. This helps any remaining pieces of the stone to pass. It can also help prevent new stones from forming.  Eat plenty of fresh fruits and vegetables.  Follow instructions from your health care provider about eating and drinking restrictions. You may be  instructed: ? To reduce how much salt (sodium) you eat or drink. Check ingredients and nutrition facts on packaged foods and beverages. ? To reduce how much meat you eat.  Eat the recommended amount of calcium for your age and gender. Ask your health care provider how much calcium you should have. General instructions  Get plenty of rest.  Most people can resume normal activities 1-2 days after the procedure. Ask your health care provider what activities are safe for you.  Your health care provider may direct you to lie in a certain position (postural drainage) and tap firmly (percuss) over your kidney area to help stone fragments pass. Follow instructions as told by your health care provider.  If directed, strain all urine through the strainer that was provided by your health care provider. ? Keep all fragments for your health care provider to see. Any stones that are found may be sent to a medical lab for examination. The stone may be as small as a grain of salt.  Keep all follow-up visits as told by your health care provider. This is important. Contact a health care provider if:  You have pain that is severe or does not get better with medicine.  You have nausea that is severe or does not go away.  You have blood in your urine longer than your health care provider told you to expect.  You have more blood in your urine.  You have pain during urination that does   not go away.  You urinate more frequently than usual and this does not go away.  You develop a rash or any other possible signs of an allergic reaction. Get help right away if:  You have severe pain in your back, sides, or upper abdomen.  You have severe pain while urinating.  Your urine is very dark red.  You have blood in your stool (feces).  You cannot pass any urine at all.  You feel a strong urge to urinate after emptying your bladder.  You have a fever or chills.  You develop shortness of breath,  difficulty breathing, or chest pain.  You have severe nausea that leads to persistent vomiting.  You faint. Summary  After this procedure, it is common to have some pain, discomfort, or nausea when pieces (fragments) of the kidney stone move through the tube that carries urine from the kidney to the bladder (ureter). If this pain or nausea is severe, however, you should contact your health care provider.  Most people can resume normal activities 1-2 days after the procedure. Ask your health care provider what activities are safe for you.  Drink enough water and fluids to keep your urine clear or pale yellow. This helps any remaining pieces of the stone to pass, and it can help prevent new stones from forming.  If directed, strain your urine and keep all fragments for your health care provider to see. Fragments or stones may be as small as a grain of salt.  Get help right away if you have severe pain in your back, sides, or upper abdomen or have severe pain while urinating. This information is not intended to replace advice given to you by your health care provider. Make sure you discuss any questions you have with your health care provider. Document Revised: 09/11/2018 Document Reviewed: 04/21/2016 Elsevier Patient Education  2020 Elsevier Inc.  

## 2020-04-02 ENCOUNTER — Encounter (HOSPITAL_COMMUNITY): Payer: Self-pay | Admitting: Urology

## 2020-04-04 ENCOUNTER — Encounter: Payer: Self-pay | Admitting: Urology

## 2020-04-04 ENCOUNTER — Ambulatory Visit (INDEPENDENT_AMBULATORY_CARE_PROVIDER_SITE_OTHER): Payer: Medicare Other | Admitting: Urology

## 2020-04-04 ENCOUNTER — Other Ambulatory Visit: Payer: Self-pay

## 2020-04-04 VITALS — BP 149/81 | HR 112 | Temp 99.2°F | Ht 60.0 in | Wt 179.9 lb

## 2020-04-04 DIAGNOSIS — N200 Calculus of kidney: Secondary | ICD-10-CM | POA: Diagnosis not present

## 2020-04-04 MED ORDER — CIPROFLOXACIN HCL 500 MG PO TABS
500.0000 mg | ORAL_TABLET | Freq: Once | ORAL | Status: AC
  Administered 2020-04-04: 500 mg via ORAL

## 2020-04-04 NOTE — Progress Notes (Signed)
   04/04/20  CC: followup nephrolithiasis  HPI: Leah Duncan is a 64yo here for followup for stent removal Blood pressure (!) 149/81, pulse (!) 112, temperature 99.2 F (37.3 C), height 5' (1.524 m), weight 179 lb 14.4 oz (81.6 kg). NED. A&Ox3.   No respiratory distress   Abd soft, NT, ND Normal external genitalia with patent urethral meatus  Cystoscopy Procedure Note  Patient identification was confirmed, informed consent was obtained, and patient was prepped using Betadine solution.  Lidocaine jelly was administered per urethral meatus.    Procedure: - Flexible cystoscope introduced, without any difficulty.   - Thorough search of the bladder revealed:    normal urethral meatus    normal urothelium    no stones    no ulcers     no tumors    no urethral polyps    no trabeculation  - Ureteral orifices were normal in position and appearance.  Using a grasper the right ureteral stent was removed intact  Post-Procedure: - Patient tolerated the procedure well  Assessment/ Plan: RTC 2 weeks with KUB   No follow-ups on file.  [name], MD

## 2020-04-04 NOTE — Progress Notes (Signed)
Urological Symptom Review  Patient is experiencing the following symptoms: Stream starts and stops   Review of Systems  Gastrointestinal (upper)  : Negative for upper GI symptoms  Gastrointestinal (lower) : Negative for lower GI symptoms  Constitutional : Negative for symptoms  Skin: Negative for skin symptoms  Eyes: Negative for eye symptoms  Ear/Nose/Throat : Negative for Ear/Nose/Throat symptoms  Hematologic/Lymphatic: Negative for Hematologic/Lymphatic symptoms  Cardiovascular : Negative for cardiovascular symptoms  Respiratory : Negative for respiratory symptoms  Endocrine: Negative for endocrine symptoms  Musculoskeletal: Negative for musculoskeletal symptoms  Neurological: Negative for neurological symptoms  Psychologic: Negative for psychiatric symptoms

## 2020-04-04 NOTE — Patient Instructions (Signed)

## 2020-04-07 DIAGNOSIS — N200 Calculus of kidney: Secondary | ICD-10-CM | POA: Diagnosis not present

## 2020-04-07 LAB — URINALYSIS, ROUTINE W REFLEX MICROSCOPIC
Bilirubin, UA: NEGATIVE
Glucose, UA: NEGATIVE
Ketones, UA: NEGATIVE
Nitrite, UA: NEGATIVE
Specific Gravity, UA: 1.02 (ref 1.005–1.030)
Urobilinogen, Ur: 0.2 mg/dL (ref 0.2–1.0)
pH, UA: 6.5 (ref 5.0–7.5)

## 2020-04-07 LAB — MICROSCOPIC EXAMINATION
Renal Epithel, UA: NONE SEEN /hpf
WBC, UA: >30 /hpf — AB (ref 0–5)

## 2020-04-11 DIAGNOSIS — E1165 Type 2 diabetes mellitus with hyperglycemia: Secondary | ICD-10-CM | POA: Diagnosis not present

## 2020-04-11 DIAGNOSIS — I10 Essential (primary) hypertension: Secondary | ICD-10-CM | POA: Diagnosis not present

## 2020-04-11 DIAGNOSIS — M1710 Unilateral primary osteoarthritis, unspecified knee: Secondary | ICD-10-CM | POA: Diagnosis not present

## 2020-04-15 ENCOUNTER — Ambulatory Visit: Payer: Self-pay | Admitting: Urology

## 2020-04-18 ENCOUNTER — Encounter: Payer: Self-pay | Admitting: Urology

## 2020-04-18 ENCOUNTER — Ambulatory Visit (INDEPENDENT_AMBULATORY_CARE_PROVIDER_SITE_OTHER): Payer: Medicare Other | Admitting: Urology

## 2020-04-18 ENCOUNTER — Ambulatory Visit (HOSPITAL_COMMUNITY)
Admission: RE | Admit: 2020-04-18 | Discharge: 2020-04-18 | Disposition: A | Discharge status: Home / Self Care | Payer: Medicare Other | Source: Ambulatory Visit | Attending: Urology | Admitting: Urology

## 2020-04-18 ENCOUNTER — Other Ambulatory Visit: Payer: Self-pay

## 2020-04-18 VITALS — BP 164/79 | HR 90 | Temp 98.6°F | Ht 60.0 in | Wt 180.0 lb

## 2020-04-18 DIAGNOSIS — Z466 Encounter for fitting and adjustment of urinary device: Secondary | ICD-10-CM | POA: Diagnosis not present

## 2020-04-18 DIAGNOSIS — N200 Calculus of kidney: Secondary | ICD-10-CM | POA: Diagnosis not present

## 2020-04-18 DIAGNOSIS — Z9889 Other specified postprocedural states: Secondary | ICD-10-CM | POA: Diagnosis not present

## 2020-04-18 DIAGNOSIS — N201 Calculus of ureter: Secondary | ICD-10-CM | POA: Diagnosis not present

## 2020-04-18 LAB — MICROSCOPIC EXAMINATION: Renal Epithel, UA: NONE SEEN /hpf

## 2020-04-18 LAB — URINALYSIS, ROUTINE W REFLEX MICROSCOPIC
Bilirubin, UA: NEGATIVE
Ketones, UA: NEGATIVE
Nitrite, UA: NEGATIVE
Specific Gravity, UA: 1.02 (ref 1.005–1.030)
Urobilinogen, Ur: 0.2 mg/dL (ref 0.2–1.0)
pH, UA: 6 (ref 5.0–7.5)

## 2020-04-18 NOTE — Patient Instructions (Signed)
Dietary Guidelines to Help Prevent Kidney Stones Kidney stones are deposits of minerals and salts that form inside your kidneys. Your risk of developing kidney stones may be greater depending on your diet, your lifestyle, the medicines you take, and whether you have certain medical conditions. Most people can reduce their chances of developing kidney stones by following the instructions below. Depending on your overall health and the type of kidney stones you tend to develop, your dietitian may give you more specific instructions. What are tips for following this plan? Reading food labels  Choose foods with "no salt added" or "low-salt" labels. Limit your sodium intake to less than 1500 mg per day.  Choose foods with calcium for each meal and snack. Try to eat about 300 mg of calcium at each meal. Foods that contain 200-500 mg of calcium per serving include: ? 8 oz (237 ml) of milk, fortified nondairy milk, and fortified fruit juice. ? 8 oz (237 ml) of kefir, yogurt, and soy yogurt. ? 4 oz (118 ml) of tofu. ? 1 oz of cheese. ? 1 cup (300 g) of dried figs. ? 1 cup (91 g) of cooked broccoli. ? 1-3 oz can of sardines or mackerel.  Most people need 1000 to 1500 mg of calcium each day. Talk to your dietitian about how much calcium is recommended for you. Shopping  Buy plenty of fresh fruits and vegetables. Most people do not need to avoid fruits and vegetables, even if they contain nutrients that may contribute to kidney stones.  When shopping for convenience foods, choose: ? Whole pieces of fruit. ? Premade salads with dressing on the side. ? Low-fat fruit and yogurt smoothies.  Avoid buying frozen meals or prepared deli foods.  Look for foods with live cultures, such as yogurt and kefir. Cooking  Do not add salt to food when cooking. Place a salt shaker on the table and allow each person to add his or her own salt to taste.  Use vegetable protein, such as beans, textured vegetable  protein (TVP), or tofu instead of meat in pasta, casseroles, and soups. Meal planning   Eat less salt, if told by your dietitian. To do this: ? Avoid eating processed or premade food. ? Avoid eating fast food.  Eat less animal protein, including cheese, meat, poultry, or fish, if told by your dietitian. To do this: ? Limit the number of times you have meat, poultry, fish, or cheese each week. Eat a diet free of meat at least 2 days a week. ? Eat only one serving each day of meat, poultry, fish, or seafood. ? When you prepare animal protein, cut pieces into small portion sizes. For most meat and fish, one serving is about the size of one deck of cards.  Eat at least 5 servings of fresh fruits and vegetables each day. To do this: ? Keep fruits and vegetables on hand for snacks. ? Eat 1 piece of fruit or a handful of berries with breakfast. ? Have a salad and fruit at lunch. ? Have two kinds of vegetables at dinner.  Limit foods that are high in a substance called oxalate. These include: ? Spinach. ? Rhubarb. ? Beets. ? Potato chips and french fries. ? Nuts.  If you regularly take a diuretic medicine, make sure to eat at least 1-2 fruits or vegetables high in potassium each day. These include: ? Avocado. ? Banana. ? Orange, prune, carrot, or tomato juice. ? Baked potato. ? Cabbage. ? Beans and split   peas. General instructions   Drink enough fluid to keep your urine clear or pale yellow. This is the most important thing you can do.  Talk to your health care provider and dietitian about taking daily supplements. Depending on your health and the cause of your kidney stones, you may be advised: ? Not to take supplements with vitamin C. ? To take a calcium supplement. ? To take a daily probiotic supplement. ? To take other supplements such as magnesium, fish oil, or vitamin B6.  Take all medicines and supplements as told by your health care provider.  Limit alcohol intake to no  more than 1 drink a day for nonpregnant women and 2 drinks a day for men. One drink equals 12 oz of beer, 5 oz of wine, or 1 oz of hard liquor.  Lose weight if told by your health care provider. Work with your dietitian to find strategies and an eating plan that works best for you. What foods are not recommended? Limit your intake of the following foods, or as told by your dietitian. Talk to your dietitian about specific foods you should avoid based on the type of kidney stones and your overall health. Grains Breads. Bagels. Rolls. Baked goods. Salted crackers. Cereal. Pasta. Vegetables Spinach. Rhubarb. Beets. Canned vegetables. Pickles. Olives. Meats and other protein foods Nuts. Nut butters. Large portions of meat, poultry, or fish. Salted or cured meats. Deli meats. Hot dogs. Sausages. Dairy Cheese. Beverages Regular soft drinks. Regular vegetable juice. Seasonings and other foods Seasoning blends with salt. Salad dressings. Canned soups. Soy sauce. Ketchup. Barbecue sauce. Canned pasta sauce. Casseroles. Pizza. Lasagna. Frozen meals. Potato chips. French fries. Summary  You can reduce your risk of kidney stones by making changes to your diet.  The most important thing you can do is drink enough fluid. You should drink enough fluid to keep your urine clear or pale yellow.  Ask your health care provider or dietitian how much protein from animal sources you should eat each day, and also how much salt and calcium you should have each day. This information is not intended to replace advice given to you by your health care provider. Make sure you discuss any questions you have with your health care provider. Document Revised: 09/20/2018 Document Reviewed: 05/11/2016 Elsevier Patient Education  2020 Elsevier Inc.  

## 2020-04-18 NOTE — Progress Notes (Signed)
Urological Symptom Review  Patient is experiencing the following symptoms: None   Review of Systems  Gastrointestinal (upper)  : Negative for upper GI symptoms  Gastrointestinal (lower) : Negative  Constitutional : Negative for symptoms  Skin: Negative for skin symptoms  Eyes: Negative for eye symptoms  Ear/Nose/Throat : Negative for Ear/Nose/Throat symptoms  Hematologic/Lymphatic: Negative for Hematologic/Lymphatic symptoms  Cardiovascular : Negative for cardiovascular symptoms  Respiratory : Negative for respiratory symptoms  Endocrine: Negative for endocrine symptoms  Musculoskeletal: Negative for musculoskeletal symptoms  Neurological: Negative for neurological symptoms  Psychologic: Negative for psychiatric symptoms

## 2020-04-18 NOTE — Progress Notes (Signed)
04/18/2020 2:45 PM   Leah Duncan 1956/06/10 [id number]  Referring provider: Monico Blitz, MD [address] [place],  [place] [address]  followup ESWL  HPI: Leah Duncan is a 64yo here for followup after ESWL and right stent removal. She has passed numerous fragments since last visit. KUB from today shows no obvious right ureteral calculi. She denies any flank pain. No worsening LUTS. NO hematuria   PMH: Past Medical History:  Diagnosis Date  . Arthritis   . COPD (chronic obstructive pulmonary disease) ([place])   . Depression   . GERD (gastroesophageal reflux disease)   . Headache(784.0)   . Hypertension   . Kidney stones 02/2013  . Shortness of breath    anxiety, exertion    Surgical History: Past Surgical History:  Procedure Laterality Date  . ABDOMINAL HYSTERECTOMY    . CHOLECYSTECTOMY    . EXTRACORPOREAL SHOCK WAVE LITHOTRIPSY Right 04/01/2020   Procedure: EXTRACORPOREAL SHOCK WAVE LITHOTRIPSY (ESWL);  Surgeon: Cleon Gustin, MD;  Location: [facility];  Service: Urology;  Laterality: Right;  . HEMORRHOID SURGERY    . KNEE ARTHROSCOPY Bilateral   . TOTAL KNEE ARTHROPLASTY Left 2011  . TOTAL KNEE ARTHROPLASTY Left 05/15/2013   Dr Durward Fortes  . TOTAL KNEE REVISION Left 05/15/2013   Procedure: TOTAL KNEE REVISION;  Surgeon: Garald Balding, MD;  Location: [facility];  Service: Orthopedics;  Laterality: Left;    Home Medications:  Allergies as of 04/18/2020   No Known Allergies     Medication List       Accurate as of April 18, 2020  2:45 PM. If you have any questions, ask your nurse or doctor.        aspirin 81 MG chewable tablet Chew by mouth daily.   aspirin 325 MG EC tablet Take 1 tablet by mouth 2 (two) times daily.   ergocalciferol 1.25 MG (50000 UT) capsule Commonly known as: VITAMIN D2 Take 50,000 Units by mouth once a week.   lisinopril 10 MG tablet Commonly known as: ZESTRIL Take 10 mg by mouth daily.   ondansetron 4 MG tablet Commonly known as:  Zofran Take 1 tablet (4 mg total) by mouth daily as needed for nausea or vomiting.   oxyCODONE 5 MG immediate release tablet Commonly known as: Oxy IR/ROXICODONE Take 1-2 tablets (5-10 mg total) by mouth every 4 (four) hours as needed for moderate pain, severe pain or breakthrough pain.   pantoprazole 40 MG tablet Commonly known as: PROTONIX Take 40 mg by mouth 2 (two) times daily.   ProAir HFA 108 (90 Base) MCG/ACT inhaler Generic drug: albuterol Inhale 2 puffs into the lungs every 6 (six) hours as needed for wheezing or shortness of breath.   rosuvastatin 10 MG tablet Commonly known as: CRESTOR Take 10 mg by mouth at bedtime.   tamsulosin 0.4 MG Caps capsule Commonly known as: FLOMAX Take 1 capsule (0.4 mg total) by mouth daily after supper.       Allergies: No Known Allergies  Family History: History reviewed. No pertinent family history.  Social History:  reports that she has never smoked. She has never used smokeless tobacco. She reports that she does not drink alcohol and does not use drugs.  ROS: All other review of systems were reviewed and are negative except what is noted above in HPI  Physical Exam: BP (!) 164/79   Pulse 90   Temp 98.6 F (37 C)   Ht 5' (1.524 m)   Wt 180 lb (81.6  kg)   BMI 35.15 kg/m   Constitutional:  Alert and oriented, No acute distress. HEENT: [place] AT, moist mucus membranes.  Trachea midline, no masses. Cardiovascular: No clubbing, cyanosis, or edema. Respiratory: Normal respiratory effort, no increased work of breathing. GI: Abdomen is soft, nontender, nondistended, no abdominal masses GU: No CVA tenderness.  Lymph: No cervical or inguinal lymphadenopathy. Skin: No rashes, bruises or suspicious lesions. Neurologic: Grossly intact, no focal deficits, moving all 4 extremities. Psychiatric: Normal mood and affect.  Laboratory Data: Lab Results  Component Value Date   WBC 8.4 05/18/2013   HGB 9.4 (L) 05/18/2013   HCT 27.2 (L)  05/18/2013   MCV 88.0 05/18/2013   PLT 132 (L) 05/18/2013    Lab Results  Component Value Date   CREATININE 0.79 05/18/2013    No results found for: PSA  No results found for: TESTOSTERONE  No results found for: HGBA1C  Urinalysis    Component Value Date/Time   COLORURINE YELLOW 05/15/2013 1108   APPEARANCEUR Cloudy (A) 04/07/2020 1038   LABSPEC 1.013 05/15/2013 1108   PHURINE 7.0 05/15/2013 1108   GLUCOSEU Negative 04/07/2020 1038   HGBUR NEGATIVE 05/15/2013 1108   BILIRUBINUR Negative 04/07/2020 1038   KETONESUR NEGATIVE 05/15/2013 1108   PROTEINUR 2+ (A) 04/07/2020 1038   PROTEINUR NEGATIVE 05/15/2013 1108   UROBILINOGEN 0.2 05/15/2013 1108   NITRITE Negative 04/07/2020 1038   NITRITE NEGATIVE 05/15/2013 1108   LEUKOCYTESUR 2+ (A) 04/07/2020 1038    Lab Results  Component Value Date   LABMICR See below: 04/07/2020   WBCUA >30 (A) 04/07/2020   LABEPIT 0-10 04/07/2020   MUCUS Present 03/26/2020   BACTERIA Many (A) 04/07/2020    Pertinent Imaging: KUB today: Images reviewed and discussed with the patient Results for orders placed during the hospital encounter of 04/01/20  DG Abd 1 View  Narrative CLINICAL DATA:  Right-sided lithotripsy.  EXAM: ABDOMEN - 1 VIEW  COMPARISON:  03/27/2020.  FINDINGS: Double-J right ureteral stent in stable position. 8 mm and adjacent 6 mm stone fragments again noted over the right upper ureter in unchanged position. Prominent bilateral nephrolithiasis again noted without interim change. Surgical clips right upper quadrant and pelvis. No bowel distention. Degenerative change lumbar spine. Prior lower lumbar fusion.  IMPRESSION: 1. Double-J right ureteral stent in stable position. 8 mm and adjacent 6 mm stone fragments again noted over the right upper ureter in unchanged position. 2. Prominent bilateral nephrolithiasis again noted without interim change.   Electronically Signed By: Marcello Moores  Register On: 04/01/2020  07:07  No results found for this or any previous visit.  No results found for this or any previous visit.  No results found for this or any previous visit.  No results found for this or any previous visit.  No results found for this or any previous visit.  No results found for this or any previous visit.  No results found for this or any previous visit.   Assessment & Plan:    1. Kidney stones -RTC 3 months with KUB   Return in about 3 months (around 07/19/2020) for KUB.  [name], MD  [facility] [place]

## 2020-05-13 DIAGNOSIS — E1165 Type 2 diabetes mellitus with hyperglycemia: Secondary | ICD-10-CM | POA: Diagnosis not present

## 2020-05-13 DIAGNOSIS — M1710 Unilateral primary osteoarthritis, unspecified knee: Secondary | ICD-10-CM | POA: Diagnosis not present

## 2020-05-13 DIAGNOSIS — I10 Essential (primary) hypertension: Secondary | ICD-10-CM | POA: Diagnosis not present

## 2020-05-14 DIAGNOSIS — Z299 Encounter for prophylactic measures, unspecified: Secondary | ICD-10-CM | POA: Diagnosis not present

## 2020-05-14 DIAGNOSIS — E1165 Type 2 diabetes mellitus with hyperglycemia: Secondary | ICD-10-CM | POA: Diagnosis not present

## 2020-05-14 DIAGNOSIS — M1710 Unilateral primary osteoarthritis, unspecified knee: Secondary | ICD-10-CM | POA: Diagnosis not present

## 2020-05-14 DIAGNOSIS — I10 Essential (primary) hypertension: Secondary | ICD-10-CM | POA: Diagnosis not present

## 2020-05-22 DIAGNOSIS — Z0000 Encounter for general adult medical examination without abnormal findings: Secondary | ICD-10-CM | POA: Diagnosis not present

## 2020-05-22 DIAGNOSIS — E7800 Pure hypercholesterolemia, unspecified: Secondary | ICD-10-CM | POA: Diagnosis not present

## 2020-05-22 DIAGNOSIS — Z7189 Other specified counseling: Secondary | ICD-10-CM | POA: Diagnosis not present

## 2020-05-22 DIAGNOSIS — Z79899 Other long term (current) drug therapy: Secondary | ICD-10-CM | POA: Diagnosis not present

## 2020-05-22 DIAGNOSIS — I10 Essential (primary) hypertension: Secondary | ICD-10-CM | POA: Diagnosis not present

## 2020-05-22 DIAGNOSIS — Z299 Encounter for prophylactic measures, unspecified: Secondary | ICD-10-CM | POA: Diagnosis not present

## 2020-05-22 DIAGNOSIS — R5383 Other fatigue: Secondary | ICD-10-CM | POA: Diagnosis not present

## 2020-06-13 DIAGNOSIS — M1710 Unilateral primary osteoarthritis, unspecified knee: Secondary | ICD-10-CM | POA: Diagnosis not present

## 2020-06-13 DIAGNOSIS — I10 Essential (primary) hypertension: Secondary | ICD-10-CM | POA: Diagnosis not present

## 2020-06-13 DIAGNOSIS — E1165 Type 2 diabetes mellitus with hyperglycemia: Secondary | ICD-10-CM | POA: Diagnosis not present

## 2020-07-14 DIAGNOSIS — E1165 Type 2 diabetes mellitus with hyperglycemia: Secondary | ICD-10-CM | POA: Diagnosis not present

## 2020-07-14 DIAGNOSIS — I10 Essential (primary) hypertension: Secondary | ICD-10-CM | POA: Diagnosis not present

## 2020-07-14 DIAGNOSIS — M1710 Unilateral primary osteoarthritis, unspecified knee: Secondary | ICD-10-CM | POA: Diagnosis not present

## 2020-07-22 ENCOUNTER — Ambulatory Visit: Payer: Medicare Other | Admitting: Urology

## 2020-08-15 DIAGNOSIS — R809 Proteinuria, unspecified: Secondary | ICD-10-CM | POA: Diagnosis not present

## 2020-08-15 DIAGNOSIS — I10 Essential (primary) hypertension: Secondary | ICD-10-CM | POA: Diagnosis not present

## 2020-08-15 DIAGNOSIS — E1165 Type 2 diabetes mellitus with hyperglycemia: Secondary | ICD-10-CM | POA: Diagnosis not present

## 2020-08-15 DIAGNOSIS — Z789 Other specified health status: Secondary | ICD-10-CM | POA: Diagnosis not present

## 2020-08-15 DIAGNOSIS — E1142 Type 2 diabetes mellitus with diabetic polyneuropathy: Secondary | ICD-10-CM | POA: Diagnosis not present

## 2020-08-15 DIAGNOSIS — Z299 Encounter for prophylactic measures, unspecified: Secondary | ICD-10-CM | POA: Diagnosis not present

## 2020-08-15 DIAGNOSIS — E1129 Type 2 diabetes mellitus with other diabetic kidney complication: Secondary | ICD-10-CM | POA: Diagnosis not present

## 2020-09-02 DIAGNOSIS — Z96651 Presence of right artificial knee joint: Secondary | ICD-10-CM | POA: Diagnosis not present

## 2020-09-02 DIAGNOSIS — M25561 Pain in right knee: Secondary | ICD-10-CM | POA: Diagnosis not present

## 2020-09-08 ENCOUNTER — Ambulatory Visit (HOSPITAL_COMMUNITY)
Admission: RE | Admit: 2020-09-08 | Discharge: 2020-09-08 | Disposition: A | Discharge status: Home / Self Care | Payer: Medicare Other | Source: Ambulatory Visit | Attending: Urology | Admitting: Urology

## 2020-09-08 ENCOUNTER — Ambulatory Visit (INDEPENDENT_AMBULATORY_CARE_PROVIDER_SITE_OTHER): Payer: Medicare Other | Admitting: Urology

## 2020-09-08 ENCOUNTER — Encounter: Payer: Self-pay | Admitting: Urology

## 2020-09-08 ENCOUNTER — Other Ambulatory Visit: Payer: Self-pay

## 2020-09-08 VITALS — BP 117/73 | HR 86 | Ht 60.0 in | Wt 185.0 lb

## 2020-09-08 DIAGNOSIS — N200 Calculus of kidney: Secondary | ICD-10-CM

## 2020-09-08 DIAGNOSIS — N2889 Other specified disorders of kidney and ureter: Secondary | ICD-10-CM | POA: Diagnosis not present

## 2020-09-08 DIAGNOSIS — Z9049 Acquired absence of other specified parts of digestive tract: Secondary | ICD-10-CM | POA: Diagnosis not present

## 2020-09-08 DIAGNOSIS — Z981 Arthrodesis status: Secondary | ICD-10-CM | POA: Diagnosis not present

## 2020-09-08 LAB — URINALYSIS, ROUTINE W REFLEX MICROSCOPIC
Bilirubin, UA: NEGATIVE
Glucose, UA: NEGATIVE
Ketones, UA: NEGATIVE
Nitrite, UA: NEGATIVE
Specific Gravity, UA: 1.015 (ref 1.005–1.030)
Urobilinogen, Ur: 0.2 mg/dL (ref 0.2–1.0)
pH, UA: 5.5 (ref 5.0–7.5)

## 2020-09-08 LAB — MICROSCOPIC EXAMINATION: Renal Epithel, UA: NONE SEEN /hpf

## 2020-09-08 MED ORDER — OXYCODONE-ACETAMINOPHEN 5-325 MG PO TABS: 1.0000 | ORAL_TABLET | ORAL | 0 refills | Status: DC | PRN

## 2020-09-08 MED ORDER — ONDANSETRON HCL 4 MG PO TABS: 4.0000 mg | ORAL_TABLET | Freq: Every day | ORAL | 1 refills | Status: DC | PRN

## 2020-09-08 MED ORDER — TAMSULOSIN HCL 0.4 MG PO CAPS: 0.4000 mg | ORAL_CAPSULE | Freq: Every day | ORAL | 1 refills | Status: DC

## 2020-09-08 NOTE — Progress Notes (Signed)
Urological Symptom Review  Patient is experiencing the following symptoms: Negative    Review of Systems  Gastrointestinal (upper)  : Negative for upper GI symptoms  Gastrointestinal (lower) : Negative for lower GI symptoms  Constitutional : Negative  Skin: Negative for skin symptoms  Eyes: Negative for eye symptoms    Ear/Nose/Throat : Negative for Ear/Nose/Throat symptoms  Hematologic/Lymphatic: Negative for Hematologic/Lymphatic symptoms  Cardiovascular : Negative for cardiovascular symptoms  Respiratory : Negative for respiratory symptoms  Endocrine: Negative for endocrine symptoms  Musculoskeletal: Negative for musculoskeletal symptoms  Neurological: Negative for neurological symptoms  Psychologic: Negative for psychiatric symptoms

## 2020-09-08 NOTE — Progress Notes (Signed)
09/08/2020 11:56 AM   Leah Duncan Mar 18, 1956 [id number]  Referring provider: Monico Blitz, MD [address] [place],  [place] [address]  followup nephrolithiasis  HPI: Leah Duncan is a 65yo here for followup for nephrolithiasis. She was doing well until 2 weeks ago when she developed sharp, intermittent, mild to moderate right flank pain. KUb from today showed her right lower pole calculus had migrated into the right UPJ. She denies any LUTS. No fevers. No flank pain currently   PMH: Past Medical History:  Diagnosis Date  . Arthritis   . COPD (chronic obstructive pulmonary disease) ([place])   . Depression   . GERD (gastroesophageal reflux disease)   . Headache(784.0)   . Hypertension   . Kidney stones 02/2013  . Shortness of breath    anxiety, exertion    Surgical History: Past Surgical History:  Procedure Laterality Date  . ABDOMINAL HYSTERECTOMY    . CHOLECYSTECTOMY    . EXTRACORPOREAL SHOCK WAVE LITHOTRIPSY Right 04/01/2020   Procedure: EXTRACORPOREAL SHOCK WAVE LITHOTRIPSY (ESWL);  Surgeon: Cleon Gustin, MD;  Location: [facility];  Service: Urology;  Laterality: Right;  . HEMORRHOID SURGERY    . KNEE ARTHROSCOPY Bilateral   . TOTAL KNEE ARTHROPLASTY Left 2011  . TOTAL KNEE ARTHROPLASTY Left 05/15/2013   Dr Durward Fortes  . TOTAL KNEE REVISION Left 05/15/2013   Procedure: TOTAL KNEE REVISION;  Surgeon: Garald Balding, MD;  [facility];  Service: Orthopedics;  Laterality: Left;    Home Medications:  Allergies as of 09/08/2020   No Known Allergies     Medication List       Accurate as of September 08, 2020 11:56 AM. If you have any questions, ask your nurse or doctor.        aspirin 81 MG chewable tablet Chew by mouth daily.   aspirin 325 MG EC tablet Take 1 tablet by mouth 2 (two) times daily.   ergocalciferol 1.25 MG (50000 UT) capsule Commonly known as: VITAMIN D2 Take 50,000 Units by mouth once a week.   lisinopril 10 MG tablet Commonly known as:  ZESTRIL Take 10 mg by mouth daily.   ondansetron 4 MG tablet Commonly known as: Zofran Take 1 tablet (4 mg total) by mouth daily as needed for nausea or vomiting.   oxyCODONE 5 MG immediate release tablet Commonly known as: Oxy IR/ROXICODONE Take 1-2 tablets (5-10 mg total) by mouth every 4 (four) hours as needed for moderate pain, severe pain or breakthrough pain.   pantoprazole 40 MG tablet Commonly known as: PROTONIX Take 40 mg by mouth 2 (two) times daily.   ProAir HFA 108 (90 Base) MCG/ACT inhaler Generic drug: albuterol Inhale 2 puffs into the lungs every 6 (six) hours as needed for wheezing or shortness of breath.   rosuvastatin 10 MG tablet Commonly known as: CRESTOR Take 10 mg by mouth at bedtime.       Allergies: No Known Allergies  Family History: History reviewed. No pertinent family history.  Social History:  reports that she has never smoked. She has never used smokeless tobacco. She reports that she does not drink alcohol and does not use drugs.  ROS: All other review of systems were reviewed and are negative except what is noted above in HPI  Physical Exam: BP 117/73   Pulse 86   Ht 5' (1.524 m)   Wt 185 lb (83.9 kg)   BMI 36.13 kg/m   Constitutional:  Alert and oriented, No acute distress. HEENT: [place]  AT, moist mucus membranes.  Trachea midline, no masses. Cardiovascular: No clubbing, cyanosis, or edema. Respiratory: Normal respiratory effort, no increased work of breathing. GI: Abdomen is soft, nontender, nondistended, no abdominal masses GU: No CVA tenderness.  Lymph: No cervical or inguinal lymphadenopathy. Skin: No rashes, bruises or suspicious lesions. Neurologic: Grossly intact, no focal deficits, moving all 4 extremities. Psychiatric: Normal mood and affect.  Laboratory Data: Lab Results  Component Value Date   WBC 8.4 05/18/2013   HGB 9.4 (L) 05/18/2013   HCT 27.2 (L) 05/18/2013   MCV 88.0 05/18/2013   PLT 132 (L) 05/18/2013     Lab Results  Component Value Date   CREATININE 0.79 05/18/2013    No results found for: PSA  No results found for: TESTOSTERONE  No results found for: HGBA1C  Urinalysis    Component Value Date/Time   COLORURINE YELLOW 05/15/2013 1108   APPEARANCEUR Clear 04/18/2020 1550   LABSPEC 1.013 05/15/2013 1108   PHURINE 7.0 05/15/2013 1108   GLUCOSEU Trace (A) 04/18/2020 1550   HGBUR NEGATIVE 05/15/2013 1108   BILIRUBINUR Negative 04/18/2020 [place] 05/15/2013 1108   PROTEINUR 2+ (A) 04/18/2020 1550   PROTEINUR NEGATIVE 05/15/2013 1108   UROBILINOGEN 0.2 05/15/2013 1108   NITRITE Negative 04/18/2020 1550   NITRITE NEGATIVE 05/15/2013 1108   LEUKOCYTESUR Trace (A) 04/18/2020 1550    Lab Results  Component Value Date   LABMICR See below: 04/18/2020   WBCUA 6-10 (A) 04/18/2020   LABEPIT 0-10 04/18/2020   MUCUS Present 04/18/2020   BACTERIA Moderate (A) 04/18/2020    Pertinent Imaging: KUb today: Images reviewed and discussed with the patient. Results for orders placed during the hospital encounter of 04/18/20  Abdomen 1 view (KUB)  Narrative CLINICAL DATA:  Follow-up stent removal  EXAM: ABDOMEN - 1 VIEW  COMPARISON:  04/01/2020  FINDINGS: Scattered large and small bowel gas is noted. Bilateral renal calculi are identified and stable. Previously seen right-sided ureteral stent and ureteral stones have been removed in the interval. No new ureteral stones are seen. No free air is noted. Postsurgical changes in the lower lumbar spine are noted.  IMPRESSION: Stable bilateral renal calculi when compared with the prior exam.  Interval removal of the right ureteral stent as well as proximal right ureteral stones.   Electronically Signed By: Inez Catalina M.D. On: 04/21/2020 09:37  No results found for this or any previous visit.  No results found for this or any previous visit.  No results found for this or any previous visit.  No  results found for this or any previous visit.  No results found for this or any previous visit.  No results found for this or any previous visit.  No results found for this or any previous visit.   Assessment & Plan:    1. Kidney stones -We discussed the management of kidney stones. These options include observation, ureteroscopy, shockwave lithotripsy (ESWL) and percutaneous nephrolithotomy (PCNL). We discussed which options are relevant to the patient's stone(s). We discussed the natural history of kidney stones as well as the complications of untreated stones and the impact on quality of life without treatment as well as with each of the above listed treatments. We also discussed the efficacy of each treatment in its ability to clear the stone burden. With any of these management options I discussed the signs and symptoms of infection and the need for emergent treatment should these be experienced. For each option we discussed the ability  of each procedure to clear the patient of their stone burden.   For observation I described the risks which include but are not limited to silent renal damage, life-threatening infection, need for emergent surgery, failure to pass stone and pain.   For ureteroscopy I described the risks which include bleeding, infection, damage to contiguous structures, positioning injury, ureteral stricture, ureteral avulsion, ureteral injury, need for prolonged ureteral stent, inability to perform ureteroscopy, need for an interval procedure, inability to clear stone burden, stent discomfort/pain, heart attack, stroke, pulmonary embolus and the inherent risks with general anesthesia.   For shockwave lithotripsy I described the risks which include arrhythmia, kidney contusion, kidney hemorrhage, need for transfusion, pain, inability to adequately break up stone, inability to pass stone fragments, Steinstrasse, infection associated with obstructing stones, need for alternate  surgical procedure, need for repeat shockwave lithotripsy, MI, CVA, PE and the inherent risks with anesthesia/conscious sedation.   For PCNL I described the risks including positioning injury, pneumothorax, hydrothorax, need for chest tube, inability to clear stone burden, renal laceration, arterial venous fistula or malformation, need for embolization of kidney, loss of kidney or renal function, need for repeat procedure, need for prolonged nephrostomy tube, ureteral avulsion, MI, CVA, PE and the inherent risks of general anesthesia.   - The patient would like to proceed with medical expulsive therapy. RTC 2 weeks with KUB   Return in about 4 weeks (around 10/06/2020).  [name], MD  [facility] [place]

## 2020-09-08 NOTE — H&P (View-Only) (Signed)
09/08/2020 11:56 AM   Leah Duncan 04-30-1956 [id number]  Referring provider: Monico Blitz, MD [address] [place],  [place] [address]  followup nephrolithiasis  HPI: Leah Duncan is a 65yo here for followup for nephrolithiasis. She was doing well until 2 weeks ago when she developed sharp, intermittent, mild to moderate right flank pain. KUb from today showed her right lower pole calculus had migrated into the right UPJ. She denies any LUTS. No fevers. No flank pain currently   PMH: Past Medical History:  Diagnosis Date  . Arthritis   . COPD (chronic obstructive pulmonary disease) ([place])   . Depression   . GERD (gastroesophageal reflux disease)   . Headache(784.0)   . Hypertension   . Kidney stones 02/2013  . Shortness of breath    anxiety, exertion    Surgical History: Past Surgical History:  Procedure Laterality Date  . ABDOMINAL HYSTERECTOMY    . CHOLECYSTECTOMY    . EXTRACORPOREAL SHOCK WAVE LITHOTRIPSY Right 04/01/2020   Procedure: EXTRACORPOREAL SHOCK WAVE LITHOTRIPSY (ESWL);  Surgeon: Cleon Gustin, MD;  Location: [facility];  Service: Urology;  Laterality: Right;  . HEMORRHOID SURGERY    . KNEE ARTHROSCOPY Bilateral   . TOTAL KNEE ARTHROPLASTY Left 2011  . TOTAL KNEE ARTHROPLASTY Left 05/15/2013   Dr Durward Fortes  . TOTAL KNEE REVISION Left 05/15/2013   Procedure: TOTAL KNEE REVISION;  Surgeon: Garald Balding, MD;  Location: [facility];  Service: Orthopedics;  Laterality: Left;    Home Medications:  Allergies as of 09/08/2020   No Known Allergies     Medication List       Accurate as of September 08, 2020 11:56 AM. If you have any questions, ask your nurse or doctor.        aspirin 81 MG chewable tablet Chew by mouth daily.   aspirin 325 MG EC tablet Take 1 tablet by mouth 2 (two) times daily.   ergocalciferol 1.25 MG (50000 UT) capsule Commonly known as: VITAMIN D2 Take 50,000 Units by mouth once a week.   lisinopril 10 MG tablet Commonly known as:  ZESTRIL Take 10 mg by mouth daily.   ondansetron 4 MG tablet Commonly known as: Zofran Take 1 tablet (4 mg total) by mouth daily as needed for nausea or vomiting.   oxyCODONE 5 MG immediate release tablet Commonly known as: Oxy IR/ROXICODONE Take 1-2 tablets (5-10 mg total) by mouth every 4 (four) hours as needed for moderate pain, severe pain or breakthrough pain.   pantoprazole 40 MG tablet Commonly known as: PROTONIX Take 40 mg by mouth 2 (two) times daily.   ProAir HFA 108 (90 Base) MCG/ACT inhaler Generic drug: albuterol Inhale 2 puffs into the lungs every 6 (six) hours as needed for wheezing or shortness of breath.   rosuvastatin 10 MG tablet Commonly known as: CRESTOR Take 10 mg by mouth at bedtime.       Allergies: No Known Allergies  Family History: History reviewed. No pertinent family history.  Social History:  reports that she has never smoked. She has never used smokeless tobacco. She reports that she does not drink alcohol and does not use drugs.  ROS: All other review of systems were reviewed and are negative except what is noted above in HPI  Physical Exam: BP 117/73   Pulse 86   Ht 5' (1.524 m)   Wt 185 lb (83.9 kg)   BMI 36.13 kg/m   Constitutional:  Alert and oriented, No acute distress. HEENT: [place]  AT, moist mucus membranes.  Trachea midline, no masses. Cardiovascular: No clubbing, cyanosis, or edema. Respiratory: Normal respiratory effort, no increased work of breathing. GI: Abdomen is soft, nontender, nondistended, no abdominal masses GU: No CVA tenderness.  Lymph: No cervical or inguinal lymphadenopathy. Skin: No rashes, bruises or suspicious lesions. Neurologic: Grossly intact, no focal deficits, moving all 4 extremities. Psychiatric: Normal mood and affect.  Laboratory Data: Lab Results  Component Value Date   WBC 8.4 05/18/2013   HGB 9.4 (L) 05/18/2013   HCT 27.2 (L) 05/18/2013   MCV 88.0 05/18/2013   PLT 132 (L) 05/18/2013     Lab Results  Component Value Date   CREATININE 0.79 05/18/2013    No results found for: PSA  No results found for: TESTOSTERONE  No results found for: HGBA1C  Urinalysis    Component Value Date/Time   COLORURINE YELLOW 05/15/2013 1108   APPEARANCEUR Clear 04/18/2020 1550   LABSPEC 1.013 05/15/2013 1108   PHURINE 7.0 05/15/2013 1108   GLUCOSEU Trace (A) 04/18/2020 1550   HGBUR NEGATIVE 05/15/2013 1108   BILIRUBINUR Negative 04/18/2020 [place] 05/15/2013 1108   PROTEINUR 2+ (A) 04/18/2020 1550   PROTEINUR NEGATIVE 05/15/2013 1108   UROBILINOGEN 0.2 05/15/2013 1108   NITRITE Negative 04/18/2020 1550   NITRITE NEGATIVE 05/15/2013 1108   LEUKOCYTESUR Trace (A) 04/18/2020 1550    Lab Results  Component Value Date   LABMICR See below: 04/18/2020   WBCUA 6-10 (A) 04/18/2020   LABEPIT 0-10 04/18/2020   MUCUS Present 04/18/2020   BACTERIA Moderate (A) 04/18/2020    Pertinent Imaging: KUb today: Images reviewed and discussed with the patient. Results for orders placed during the hospital encounter of 04/18/20  Abdomen 1 view (KUB)  Narrative CLINICAL DATA:  Follow-up stent removal  EXAM: ABDOMEN - 1 VIEW  COMPARISON:  04/01/2020  FINDINGS: Scattered large and small bowel gas is noted. Bilateral renal calculi are identified and stable. Previously seen right-sided ureteral stent and ureteral stones have been removed in the interval. No new ureteral stones are seen. No free air is noted. Postsurgical changes in the lower lumbar spine are noted.  IMPRESSION: Stable bilateral renal calculi when compared with the prior exam.  Interval removal of the right ureteral stent as well as proximal right ureteral stones.   Electronically Signed By: Inez Catalina M.D. On: 04/21/2020 09:37  No results found for this or any previous visit.  No results found for this or any previous visit.  No results found for this or any previous visit.  No  results found for this or any previous visit.  No results found for this or any previous visit.  No results found for this or any previous visit.  No results found for this or any previous visit.   Assessment & Plan:    1. Kidney stones -We discussed the management of kidney stones. These options include observation, ureteroscopy, shockwave lithotripsy (ESWL) and percutaneous nephrolithotomy (PCNL). We discussed which options are relevant to the patient's stone(s). We discussed the natural history of kidney stones as well as the complications of untreated stones and the impact on quality of life without treatment as well as with each of the above listed treatments. We also discussed the efficacy of each treatment in its ability to clear the stone burden. With any of these management options I discussed the signs and symptoms of infection and the need for emergent treatment should these be experienced. For each option we discussed the ability  of each procedure to clear the patient of their stone burden.   For observation I described the risks which include but are not limited to silent renal damage, life-threatening infection, need for emergent surgery, failure to pass stone and pain.   For ureteroscopy I described the risks which include bleeding, infection, damage to contiguous structures, positioning injury, ureteral stricture, ureteral avulsion, ureteral injury, need for prolonged ureteral stent, inability to perform ureteroscopy, need for an interval procedure, inability to clear stone burden, stent discomfort/pain, heart attack, stroke, pulmonary embolus and the inherent risks with general anesthesia.   For shockwave lithotripsy I described the risks which include arrhythmia, kidney contusion, kidney hemorrhage, need for transfusion, pain, inability to adequately break up stone, inability to pass stone fragments, Steinstrasse, infection associated with obstructing stones, need for alternate  surgical procedure, need for repeat shockwave lithotripsy, MI, CVA, PE and the inherent risks with anesthesia/conscious sedation.   For PCNL I described the risks including positioning injury, pneumothorax, hydrothorax, need for chest tube, inability to clear stone burden, renal laceration, arterial venous fistula or malformation, need for embolization of kidney, loss of kidney or renal function, need for repeat procedure, need for prolonged nephrostomy tube, ureteral avulsion, MI, CVA, PE and the inherent risks of general anesthesia.   - The patient would like to proceed with medical expulsive therapy. RTC 2 weeks with KUB   Return in about 4 weeks (around 10/06/2020).  [name], MD  [facility] [place]

## 2020-09-08 NOTE — Patient Instructions (Addendum)

## 2020-09-10 ENCOUNTER — Other Ambulatory Visit: Payer: Self-pay | Admitting: Urology

## 2020-09-10 ENCOUNTER — Telehealth: Payer: Self-pay

## 2020-09-10 ENCOUNTER — Other Ambulatory Visit: Payer: Self-pay

## 2020-09-10 MED ORDER — TAMSULOSIN HCL 0.4 MG PO CAPS
0.4000 mg | ORAL_CAPSULE | Freq: Every day | ORAL | 1 refills | Status: DC
Start: 2020-09-10 — End: 2020-10-07

## 2020-09-10 MED ORDER — OXYCODONE-ACETAMINOPHEN 5-325 MG PO TABS
1.0000 | ORAL_TABLET | ORAL | 0 refills | Status: DC | PRN
Start: 2020-09-10 — End: 2020-10-03

## 2020-09-10 NOTE — Telephone Encounter (Signed)
Pt called saying she called pharmacy and meds sent in at office visit were not there. Pt wanted [facility] [place] [facility] of [place]. [facility] and [facility] notified.

## 2020-10-02 ENCOUNTER — Other Ambulatory Visit: Payer: Self-pay | Admitting: Urology

## 2020-10-03 ENCOUNTER — Encounter: Payer: Self-pay | Admitting: Urology

## 2020-10-03 ENCOUNTER — Ambulatory Visit (INDEPENDENT_AMBULATORY_CARE_PROVIDER_SITE_OTHER): Payer: Medicare Other | Admitting: Urology

## 2020-10-03 ENCOUNTER — Other Ambulatory Visit: Payer: Self-pay

## 2020-10-03 ENCOUNTER — Ambulatory Visit (HOSPITAL_COMMUNITY)
Admission: RE | Admit: 2020-10-03 | Discharge: 2020-10-03 | Disposition: A | Discharge status: Home / Self Care | Payer: Medicare Other | Source: Ambulatory Visit | Attending: Urology | Admitting: Urology

## 2020-10-03 VITALS — BP 116/71 | HR 98 | Temp 98.1°F | Ht 60.0 in | Wt 185.0 lb

## 2020-10-03 DIAGNOSIS — N200 Calculus of kidney: Secondary | ICD-10-CM | POA: Insufficient documentation

## 2020-10-03 LAB — URINALYSIS, ROUTINE W REFLEX MICROSCOPIC
Bilirubin, UA: NEGATIVE
Glucose, UA: NEGATIVE
Ketones, UA: NEGATIVE
Nitrite, UA: NEGATIVE
Specific Gravity, UA: 1.02 (ref 1.005–1.030)
Urobilinogen, Ur: 0.2 mg/dL (ref 0.2–1.0)
pH, UA: 6 (ref 5.0–7.5)

## 2020-10-03 LAB — MICROSCOPIC EXAMINATION
Epithelial Cells (non renal): >10 /hpf — AB (ref 0–10)
Renal Epithel, UA: NONE SEEN /hpf

## 2020-10-03 MED ORDER — NITROFURANTOIN MONOHYD MACRO 100 MG PO CAPS: 100.0000 mg | ORAL_CAPSULE | Freq: Two times a day (BID) | ORAL | 0 refills | Status: DC

## 2020-10-03 MED ORDER — OXYCODONE-ACETAMINOPHEN 5-325 MG PO TABS: 1.0000 | ORAL_TABLET | ORAL | 0 refills | Status: AC | PRN

## 2020-10-03 MED ORDER — OXYCODONE-ACETAMINOPHEN 5-325 MG PO TABS: 1.0000 | ORAL_TABLET | ORAL | 0 refills | Status: DC | PRN

## 2020-10-03 NOTE — Progress Notes (Signed)
10/03/2020 2:28 PM   CYDNEE FUQUAY [birth year] [id number]  Referring provider: Monico Blitz, MD [address] [place],  [place] [address]  Right ureteral calculus  HPI: Ms Remmert is a 65yo here for followup for a right ureteral calculus. She has not passed her calculus. KUB from today shows the right ureteral calculus has migrated 1-2cm but remains int he proximal ureter. She has intermittent right flank pain. No worsening LUTS.    PMH: Past Medical History:  Diagnosis Date  . Arthritis   . COPD (chronic obstructive pulmonary disease) ([place])   . Depression   . GERD (gastroesophageal reflux disease)   . Headache(784.0)   . Hypertension   . Kidney stones 02/2013  . Shortness of breath    anxiety, exertion    Surgical History: Past Surgical History:  Procedure Laterality Date  . ABDOMINAL HYSTERECTOMY    . CHOLECYSTECTOMY    . EXTRACORPOREAL SHOCK WAVE LITHOTRIPSY Right 04/01/2020   Procedure: EXTRACORPOREAL SHOCK WAVE LITHOTRIPSY (ESWL);  Surgeon: Cleon Gustin, MD;  Location: [facility];  Service: Urology;  Laterality: Right;  . HEMORRHOID SURGERY    . KNEE ARTHROSCOPY Bilateral   . TOTAL KNEE ARTHROPLASTY Left 2011  . TOTAL KNEE ARTHROPLASTY Left 05/15/2013   Dr Durward Fortes  . TOTAL KNEE REVISION Left 05/15/2013   Procedure: TOTAL KNEE REVISION;  Surgeon: Garald Balding, MD;  Location: [facility];  Service: Orthopedics;  Laterality: Left;    Home Medications:  Allergies as of 10/03/2020   No Known Allergies     Medication List       Accurate as of October 03, 2020  2:28 PM. If you have any questions, ask your nurse or doctor.        albuterol 108 (90 Base) MCG/ACT inhaler Commonly known as: VENTOLIN HFA Inhale 2 puffs into the lungs every 6 (six) hours as needed for wheezing or shortness of breath.   aspirin 81 MG chewable tablet Chew by mouth daily. What changed: Another medication with the same name was removed. Continue taking this medication, and follow the  directions you see here. Changed by: [name], MD   ergocalciferol 1.25 MG (50000 UT) capsule Commonly known as: VITAMIN D2 Take 50,000 Units by mouth once a week.   gabapentin 100 MG capsule Commonly known as: NEURONTIN Take 100 mg by mouth at bedtime.   Linzess 72 MCG capsule Generic drug: linaclotide Take 72 mcg by mouth daily.   lisinopril 10 MG tablet Commonly known as: ZESTRIL Take 10 mg by mouth daily.   metFORMIN 500 MG tablet Commonly known as: GLUCOPHAGE Take 1 tablet by mouth daily.   nitrofurantoin (macrocrystal-monohydrate) 100 MG capsule Commonly known as: MACROBID Take 1 capsule (100 mg total) by mouth every 12 (twelve) hours. Started by: [name], MD   ondansetron 4 MG tablet Commonly known as: Zofran Take 1 tablet (4 mg total) by mouth daily as needed for nausea or vomiting.   oxyCODONE 5 MG immediate release tablet Commonly known as: Oxy IR/ROXICODONE Take 1-2 tablets (5-10 mg total) by mouth every 4 (four) hours as needed for moderate pain, severe pain or breakthrough pain.   oxyCODONE-acetaminophen 5-325 MG tablet Commonly known as: Percocet Take 1 tablet by mouth every 4 (four) hours as needed.   pantoprazole 40 MG tablet Commonly known as: PROTONIX Take 40 mg by mouth 2 (two) times daily.   phentermine 37.5 MG capsule Take 37.5 mg by mouth daily.   rosuvastatin 10 MG tablet Commonly known as:  CRESTOR Take 10 mg by mouth at bedtime.   tamsulosin 0.4 MG Caps capsule Commonly known as: FLOMAX Take 1 capsule (0.4 mg total) by mouth daily after supper.       Allergies: No Known Allergies  Family History: No family history on file.  Social History:  reports that she has never smoked. She has never used smokeless tobacco. She reports that she does not drink alcohol and does not use drugs.  ROS: All other review of systems were reviewed and are negative except what is noted above in HPI  Physical Exam: BP 116/71    Pulse 98   Temp 98.1 F (36.7 C)   Ht 5' (1.524 m)   Wt 185 lb (83.9 kg)   BMI 36.13 kg/m   Constitutional:  Alert and oriented, No acute distress. HEENT: [place] AT, moist mucus membranes.  Trachea midline, no masses. Cardiovascular: No clubbing, cyanosis, or edema. Respiratory: Normal respiratory effort, no increased work of breathing. GI: Abdomen is soft, nontender, nondistended, no abdominal masses GU: No CVA tenderness.  Lymph: No cervical or inguinal lymphadenopathy. Skin: No rashes, bruises or suspicious lesions. Neurologic: Grossly intact, no focal deficits, moving all 4 extremities. Psychiatric: Normal mood and affect.  Laboratory Data: Lab Results  Component Value Date   WBC 8.4 05/18/2013   HGB 9.4 (L) 05/18/2013   HCT 27.2 (L) 05/18/2013   MCV 88.0 05/18/2013   PLT 132 (L) 05/18/2013    Lab Results  Component Value Date   CREATININE 0.79 05/18/2013    No results found for: PSA  No results found for: TESTOSTERONE  No results found for: HGBA1C  Urinalysis    Component Value Date/Time   COLORURINE YELLOW 05/15/2013 1108   APPEARANCEUR Clear 09/08/2020 1205   LABSPEC 1.013 05/15/2013 1108   PHURINE 7.0 05/15/2013 1108   GLUCOSEU Negative 09/08/2020 1205   HGBUR NEGATIVE 05/15/2013 1108   BILIRUBINUR Negative 09/08/2020 1205   KETONESUR NEGATIVE 05/15/2013 1108   PROTEINUR 2+ (A) 09/08/2020 1205   PROTEINUR NEGATIVE 05/15/2013 1108   UROBILINOGEN 0.2 05/15/2013 1108   NITRITE Negative 09/08/2020 1205   NITRITE NEGATIVE 05/15/2013 1108   LEUKOCYTESUR 1+ (A) 09/08/2020 1205    Lab Results  Component Value Date   LABMICR See below: 09/08/2020   WBCUA 11-30 (A) 09/08/2020   LABEPIT 0-10 09/08/2020   MUCUS Present 09/08/2020   BACTERIA Few 09/08/2020    Pertinent Imaging: KUB from today: Images reviewed and discussed with the patient. Results for orders placed during the hospital encounter of 09/08/20  Abdomen 1 view (KUB)  Narrative CLINICAL  DATA:  Nephrolithiasis  EXAM: ABDOMEN - 1 VIEW  COMPARISON:  04/18/2020  FINDINGS: There are at least 6 separate calcifications overlying the left kidney, similar to prior examination, measuring up to 7 mm in greatest diameter. Previously noted dominant calculus within the expected lower pole of the right kidney has now migrated medially and inferiorly, likely into the proximal right ureter and is seen right S1 pedicle screw, measuring 6 mm x 8 mm.  Cholecystectomy clips are seen in the right upper quadrant. No organomegaly. Normal abdominal gas pattern. Lumbosacral fusion with instrumentation has been performed.  IMPRESSION: Stable moderate left nephrolithiasis.  Interval passage of right renal calculus likely into the proximal right ureter. This calculus measures 6 x 8 mm. This can be confirmed with CT imaging.   Electronically Signed By: Fidela Salisbury MD On: 09/08/2020 23:15  No results found for this or any previous visit.  No  results found for this or any previous visit.  No results found for this or any previous visit.  No results found for this or any previous visit.  No results found for this or any previous visit.  No results found for this or any previous visit.  No results found for this or any previous visit.   Assessment & Plan:    1. Kidney stones -We discussed the management of kidney stones. These options include observation, ureteroscopy, shockwave lithotripsy (ESWL) and percutaneous nephrolithotomy (PCNL). We discussed which options are relevant to the patient's stone(s). We discussed the natural history of kidney stones as well as the complications of untreated stones and the impact on quality of life without treatment as well as with each of the above listed treatments. We also discussed the efficacy of each treatment in its ability to clear the stone burden. With any of these management options I discussed the signs and symptoms of infection and  the need for emergent treatment should these be experienced. For each option we discussed the ability of each procedure to clear the patient of their stone burden.   For observation I described the risks which include but are not limited to silent renal damage, life-threatening infection, need for emergent surgery, failure to pass stone and pain.   For ureteroscopy I described the risks which include bleeding, infection, damage to contiguous structures, positioning injury, ureteral stricture, ureteral avulsion, ureteral injury, need for prolonged ureteral stent, inability to perform ureteroscopy, need for an interval procedure, inability to clear stone burden, stent discomfort/pain, heart attack, stroke, pulmonary embolus and the inherent risks with general anesthesia.   For shockwave lithotripsy I described the risks which include arrhythmia, kidney contusion, kidney hemorrhage, need for transfusion, pain, inability to adequately break up stone, inability to pass stone fragments, Steinstrasse, infection associated with obstructing stones, need for alternate surgical procedure, need for repeat shockwave lithotripsy, MI, CVA, PE and the inherent risks with anesthesia/conscious sedation.   For PCNL I described the risks including positioning injury, pneumothorax, hydrothorax, need for chest tube, inability to clear stone burden, renal laceration, arterial venous fistula or malformation, need for embolization of kidney, loss of kidney or renal function, need for repeat procedure, need for prolonged nephrostomy tube, ureteral avulsion, MI, CVA, PE and the inherent risks of general anesthesia.   - The patient would like to proceed with right ESWL - Urinalysis, Routine w reflex microscopic - Urine culture   No follow-ups on file.  [name], MD  [facility] [place]

## 2020-10-03 NOTE — Progress Notes (Signed)
Urological Symptom Review  Patient is experiencing the following symptoms: none   Review of Systems  Gastrointestinal (upper)  : Negative for upper GI symptoms  Gastrointestinal (lower) : Negative for lower GI symptoms  Constitutional : Negative for symptoms  Skin: Negative for skin symptoms  Eyes: Negative for eye symptoms  Ear/Nose/Throat : Negative for Ear/Nose/Throat symptoms  Hematologic/Lymphatic: Negative for Hematologic/Lymphatic symptoms  Cardiovascular : Negative for cardiovascular symptoms  Respiratory : Negative for respiratory symptoms  Endocrine: Negative for endocrine symptoms  Musculoskeletal: Negative for musculoskeletal symptoms  Neurological: Negative for neurological symptoms  Psychologic: Negative for psychiatric symptoms negative

## 2020-10-03 NOTE — Patient Instructions (Signed)
[facility] (25th ed., pp. [id number]). [place], [place]: Tye [employer], [employer]. Retrieved from https://www.clinicalkey.com/#!/content/book/3-s2.0-B9781455750177001264?[email or web address]">  Lithotripsy  Lithotripsy is a treatment that can help break up kidney stones that are too large to pass on their own. This is a nonsurgical procedure that crushes a kidney stone with shock waves. These shock waves pass through your body and focus on the kidney stone. They cause the kidney stone to break up into smaller pieces while it is still in the urinary tract. The smaller pieces of stone can pass more easily out of your body in the urine. Tell a health care provider about:  Any allergies you have.  All medicines you are taking, including vitamins, herbs, eye drops, creams, and over-the-counter medicines.  Any problems you or family members have had with anesthetic medicines.  Any blood disorders you have.  Any surgeries you have had.  Any medical conditions you have.  Whether you are pregnant or may be pregnant. What are the risks? Generally, this is a safe procedure. However, problems may occur, including:  Infection.  Bleeding from the kidney.  Bruising of the kidney or skin.  Scarring of the kidney, which can lead to: ? Increased blood pressure. ? Poor kidney function. ? Return (recurrence) of kidney stones.  Damage to other structures or organs, such as the liver, colon, spleen, or pancreas.  Blockage (obstruction) of the tube that carries urine from the kidney to the bladder (ureter).  Failure of the kidney stone to break into pieces (fragments). What happens before the procedure? Staying hydrated Follow instructions from your health care provider about hydration, which may include:  Up to 2 hours before the procedure - you may continue to drink clear liquids, such as water, clear fruit juice, black coffee, and plain tea. Eating and drinking restrictions Follow  instructions from your health care provider about eating and drinking, which may include:  8 hours before the procedure - stop eating heavy meals or foods, such as meat, fried foods, or fatty foods.  6 hours before the procedure - stop eating light meals or foods, such as toast or cereal.  6 hours before the procedure - stop drinking milk or drinks that contain milk.  2 hours before the procedure - stop drinking clear liquids. Medicines Ask your health care provider about:  Changing or stopping your regular medicines. This is especially important if you are taking diabetes medicines or blood thinners.  Taking medicines such as aspirin and ibuprofen. These medicines can thin your blood. Do not take these medicines unless your health care provider tells you to take them.  Taking over-the-counter medicines, vitamins, herbs, and supplements. Tests You may have tests, such as:  Blood tests.  Urine tests.  Imaging tests, such as a CT scan. General instructions  Plan to have someone take you home from the hospital or clinic.  If you will be going home right after the procedure, plan to have someone with you for 24 hours.  Ask your health care provider what steps will be taken to help prevent infection. These may include washing skin with a germ-killing soap. What happens during the procedure?  An IV will be inserted into one of your veins.  You will be given one or more of the following: ? A medicine to help you relax (sedative). ? A medicine to make you fall asleep (general anesthetic).  A water-filled cushion may be placed behind your kidney or on your abdomen. In some cases, you may be placed in a tub of  lukewarm water.  Your body will be positioned in a way that makes it easy to target the kidney stone.  An X-ray or ultrasound exam will be done to locate your stone.  Shock waves will be aimed at the stone. If you are awake, you may feel a tapping sensation as the shock  waves pass through your body.  A flexible tube with holes in it (stent) may be placed in the ureter. This will help keep urine flowing from the kidney if the fragments of the stone have been blocking the ureter. The procedure may vary among health care providers and hospitals.   What happens after the procedure?  You may have an X-ray to see whether the procedure was able to break up the kidney stone and how much of the stone has passed. If large stone fragments remain after treatment, you may need to have a second procedure at a later time.  Your blood pressure, heart rate, breathing rate, and blood oxygen level will be monitored until you leave the hospital or clinic.  You may be given antibiotics or pain medicine as needed.  If a stent was placed in your ureter during surgery, it may stay in place for a few weeks.  You may need to strain your urine to collect pieces of the kidney stone for testing.  You will need to drink plenty of water.  If you were given a sedative during the procedure, it can affect you for several hours. Do not drive or operate machinery until your health care provider says that it is safe. Summary  Lithotripsy is a treatment that can help break up kidney stones that are too large to pass on their own.  Lithotripsy is a nonsurgical procedure that crushes a kidney stone with shock waves.  Generally, this is a safe procedure. However, problems may occur, including damage to the kidney or other organs, infection, or obstruction of the tube that carries urine from the kidney to the bladder (ureter).  You may have a stent placed in your ureter to help drain your urine. This stent may stay in place for a few weeks.  After the procedure, you will need to drink plenty of water. You may be asked to strain your urine to collect pieces of the kidney stone for testing. This information is not intended to replace advice given to you by your health care provider. Make sure  you discuss any questions you have with your health care provider. Document Revised: 03/14/2019 Document Reviewed: 03/14/2019 Elsevier Patient Education  [place].

## 2020-10-06 ENCOUNTER — Encounter (HOSPITAL_COMMUNITY)
Admission: RE | Admit: 2020-10-06 | Discharge: 2020-10-06 | Disposition: A | Discharge status: Home / Self Care | Payer: Medicare Other | Source: Ambulatory Visit | Attending: Urology | Admitting: Urology

## 2020-10-06 ENCOUNTER — Other Ambulatory Visit: Payer: Self-pay

## 2020-10-06 ENCOUNTER — Other Ambulatory Visit (HOSPITAL_COMMUNITY)
Admission: RE | Admit: 2020-10-06 | Discharge: 2020-10-06 | Disposition: A | Discharge status: Home / Self Care | Payer: Medicare Other | Source: Ambulatory Visit | Attending: Urology | Admitting: Urology

## 2020-10-06 ENCOUNTER — Encounter (HOSPITAL_COMMUNITY): Payer: Self-pay

## 2020-10-06 DIAGNOSIS — Z01812 Encounter for preprocedural laboratory examination: Secondary | ICD-10-CM | POA: Diagnosis not present

## 2020-10-06 DIAGNOSIS — Z20822 Contact with and (suspected) exposure to covid-19: Secondary | ICD-10-CM | POA: Diagnosis not present

## 2020-10-06 LAB — SARS CORONAVIRUS 2 (TAT 6-24 HRS): SARS Coronavirus 2: NEGATIVE

## 2020-10-06 NOTE — Patient Instructions (Addendum)
Your procedure is scheduled on: 10/07/2020  Report to [facility] at    9:00 AM.  Call this number if you have problems the morning of surgery: [phone]   Remember:   Do not Eat or Drink after midnight         No Smoking the morning of surgery  :  Take these medicines the morning of surgery with A SIP OF WATER: Gabapentin and pantoprazole  Oxycodone if needed  No diabetic medications am of procedure   Do not wear jewelry, make-up or nail polish.  Do not wear lotions, powders, or perfumes. You may wear deodorant.  Do not shave 48 hours prior to surgery. Men may shave face and neck.  Do not bring valuables to the hospital.  Contacts, dentures or bridgework may not be worn into surgery.  Leave suitcase in the car. After surgery it may be brought to your room.  For patients admitted to the hospital, checkout time is 11:00 AM the day of discharge.   Patients discharged the day of surgery will not be allowed to drive home.     Lithotripsy, Care After This sheet gives you information about how to care for yourself after your procedure. Your health care provider may also give you more specific instructions. If you have problems or questions, contact your health care provider. What can I expect after the procedure? After the procedure, it is common to have:  Some blood in your urine. This should only last for a few days.  Soreness in your back, sides, or upper abdomen for a few days.  Blotches or bruises on the area where the shock wave entered the skin.  Pain, discomfort, or nausea when pieces (fragments) of the kidney stone move through the tube that carries urine from the kidney to the bladder (ureter). Stone fragments may pass soon after the procedure, but they may continue to pass for up to 4-8 weeks. ? If you have severe pain or nausea, contact your health care provider. This may be caused by a large stone that was not broken up, and this may mean that you need more  treatment.  Some pain or discomfort during urination.  Some pain or discomfort in the lower abdomen or (in men) at the base of the penis. Follow these instructions at home: Medicines  Take over-the-counter and prescription medicines only as told by your health care provider.  If you were prescribed an antibiotic medicine, take it as told by your health care provider. Do not stop taking the antibiotic even if you start to feel better.  Ask your health care provider if the medicine prescribed to you requires you to avoid driving or using machinery. Eating and drinking  Drink enough fluid to keep your urine pale yellow. This helps any remaining pieces of the stone to pass. It can also help prevent new stones from forming.  Eat plenty of fresh fruits and vegetables.  Follow instructions from your health care provider about eating or drinking restrictions. You may be instructed to: ? Reduce how much salt (sodium) you eat or drink. Check ingredients and nutrition facts on packaged foods and beverages to see how much sodium they contain. ? Reduce how much meat you eat.  Eat the recommended amount of calcium for your age and gender. Ask your health care provider how much calcium you should have.      General instructions  Get plenty of rest.  Return to your normal activities as told by your  health care provider. Ask your health care provider what activities are safe for you. Most people can resume normal activities 1-2 days after the procedure.  If you were given a sedative during the procedure, it can affect you for several hours. Do not drive or operate machinery until your health care provider says that it is safe.  Your health care provider may direct you to lie in a certain position (postural drainage) and tap firmly (percuss) over your kidney area to help stone fragments pass. Follow instructions as told by your health care provider.  If directed, strain all urine through the  strainer that was provided by your health care provider. ? Keep all fragments for your health care provider to see. Any stones that are found may be sent to a medical lab for examination. The stone may be as small as a grain of salt.  Keep all follow-up visits as told by your health care provider. This is important. Contact a health care provider if:  You have a fever or chills.  You have nausea that is severe or does not go away.  You have any of these urinary symptoms: ? Blood in your urine for longer than your health care provider told you to expect. ? Urine that smells bad or unusual. ? Feeling a strong urge to urinate after emptying your bladder. ? Pain or burning with urination that does not go away. ? Urinating more often than usual and this does not go away.  You have a stent and it comes out. Get help right away if:  You have severe pain in your back, sides, or upper abdomen.  You have any of these urinary symptoms: ? Severe pain while urinating. ? More blood in your urine or having blood in your urine when you did not before. ? Passing blood clots in your urine. ? Passing only a small amount of urine or being unable to pass any urine at all.  You have severe nausea that leads to persistent vomiting.  You faint. Summary  After this procedure, it is common to have some pain, discomfort, or nausea when pieces (fragments) of the kidney stone move through the tube that carries urine from the kidney to the bladder (ureter). If this pain or nausea is severe, however, you should contact your health care provider.  Return to your normal activities as told by your health care provider. Ask your health care provider what activities are safe for you.  Drink enough fluid to keep your urine pale yellow. This helps any remaining pieces of the stone to pass, and it can help prevent new stones from forming.  If directed, strain your urine and keep all fragments for your health care  provider to see. Fragments or stones may be as small as a grain of salt.  Get help right away if you have severe pain in your back, sides, or upper abdomen, or if you have severe pain while urinating. This information is not intended to replace advice given to you by your health care provider. Make sure you discuss any questions you have with your health care provider. Document Revised: 03/14/2019 Document Reviewed: 03/14/2019 Elsevier Patient Education  2021 [place] After This sheet gives you information about how to care for yourself after your procedure. Your health care provider may also give you more specific instructions. If you have problems or questions, contact your health care provider. What can I expect after the procedure? After the  procedure, it is common to have:  Tiredness.  Forgetfulness about what happened after the procedure.  Impaired judgment for important decisions.  Nausea or vomiting.  Some difficulty with balance. Follow these instructions at home: For the time period you were told by your health care provider:  Rest as needed.  Do not participate in activities where you could fall or become injured.  Do not drive or use machinery.  Do not drink alcohol.  Do not take sleeping pills or medicines that cause drowsiness.  Do not make important decisions or sign legal documents.  Do not take care of children on your own.      Eating and drinking  Follow the diet that is recommended by your health care provider.  Drink enough fluid to keep your urine pale yellow.  If you vomit: ? Drink water, juice, or soup when you can drink without vomiting. ? Make sure you have little or no nausea before eating solid foods. General instructions  Have a responsible adult stay with you for the time you are told. It is important to have someone help care for you until you are awake and alert.  Take over-the-counter and  prescription medicines only as told by your health care provider.  If you have sleep apnea, surgery and certain medicines can increase your risk for breathing problems. Follow instructions from your health care provider about wearing your sleep device: ? Anytime you are sleeping, including during daytime naps. ? While taking prescription pain medicines, sleeping medicines, or medicines that make you drowsy.  Avoid smoking.  Keep all follow-up visits as told by your health care provider. This is important. Contact a health care provider if:  You keep feeling nauseous or you keep vomiting.  You feel light-headed.  You are still sleepy or having trouble with balance after 24 hours.  You develop a rash.  You have a fever.  You have redness or swelling around the IV site. Get help right away if:  You have trouble breathing.  You have new-onset confusion at home. Summary  For several hours after your procedure, you may feel tired. You may also be forgetful and have poor judgment.  Have a responsible adult stay with you for the time you are told. It is important to have someone help care for you until you are awake and alert.  Rest as told. Do not drive or operate machinery. Do not drink alcohol or take sleeping pills.  Get help right away if you have trouble breathing, or if you suddenly become confused. This information is not intended to replace advice given to you by your health care provider. Make sure you discuss any questions you have with your health care provider. Document Revised: 02/14/2020 Document Reviewed: 05/03/2019 Elsevier Patient Education  2021 [employer].

## 2020-10-07 ENCOUNTER — Encounter (HOSPITAL_COMMUNITY)
Admission: RE | Disposition: A | Discharge status: Home / Self Care | Payer: Self-pay | Source: Ambulatory Visit | Attending: Urology

## 2020-10-07 ENCOUNTER — Ambulatory Visit (HOSPITAL_COMMUNITY)
Admission: RE | Admit: 2020-10-07 | Discharge: 2020-10-07 | Disposition: A | Discharge status: Home / Self Care | Payer: Medicare Other | Source: Ambulatory Visit | Attending: Urology | Admitting: Urology

## 2020-10-07 DIAGNOSIS — J449 Chronic obstructive pulmonary disease, unspecified: Secondary | ICD-10-CM | POA: Diagnosis not present

## 2020-10-07 DIAGNOSIS — Z79899 Other long term (current) drug therapy: Secondary | ICD-10-CM | POA: Diagnosis not present

## 2020-10-07 DIAGNOSIS — Z7982 Long term (current) use of aspirin: Secondary | ICD-10-CM | POA: Insufficient documentation

## 2020-10-07 DIAGNOSIS — E119 Type 2 diabetes mellitus without complications: Secondary | ICD-10-CM | POA: Insufficient documentation

## 2020-10-07 DIAGNOSIS — N201 Calculus of ureter: Secondary | ICD-10-CM | POA: Diagnosis not present

## 2020-10-07 DIAGNOSIS — Z87442 Personal history of urinary calculi: Secondary | ICD-10-CM | POA: Insufficient documentation

## 2020-10-07 DIAGNOSIS — I10 Essential (primary) hypertension: Secondary | ICD-10-CM | POA: Diagnosis not present

## 2020-10-07 DIAGNOSIS — N202 Calculus of kidney with calculus of ureter: Secondary | ICD-10-CM | POA: Diagnosis not present

## 2020-10-07 DIAGNOSIS — N200 Calculus of kidney: Secondary | ICD-10-CM

## 2020-10-07 DIAGNOSIS — Z9049 Acquired absence of other specified parts of digestive tract: Secondary | ICD-10-CM | POA: Diagnosis not present

## 2020-10-07 DIAGNOSIS — E669 Obesity, unspecified: Secondary | ICD-10-CM | POA: Diagnosis not present

## 2020-10-07 DIAGNOSIS — Z96652 Presence of left artificial knee joint: Secondary | ICD-10-CM | POA: Insufficient documentation

## 2020-10-07 DIAGNOSIS — Z981 Arthrodesis status: Secondary | ICD-10-CM | POA: Diagnosis not present

## 2020-10-07 HISTORY — PX: EXTRACORPOREAL SHOCK WAVE LITHOTRIPSY: SHX1557

## 2020-10-07 LAB — GLUCOSE, CAPILLARY: Glucose-Capillary: 125 mg/dL — ABNORMAL HIGH (ref 70–99)

## 2020-10-07 LAB — URINE CULTURE

## 2020-10-07 SURGERY — LITHOTRIPSY, ESWL
Anesthesia: LOCAL | Laterality: Right

## 2020-10-07 MED ORDER — TAMSULOSIN HCL 0.4 MG PO CAPS: 0.4000 mg | ORAL_CAPSULE | Freq: Every day | ORAL | 1 refills | Status: DC

## 2020-10-07 MED ORDER — DIPHENHYDRAMINE HCL 25 MG PO CAPS
25.0000 mg | ORAL_CAPSULE | ORAL | Status: AC
  Administered 2020-10-07: 25 mg via ORAL
  Filled 2020-10-07: qty 1

## 2020-10-07 MED ORDER — DIAZEPAM 5 MG PO TABS
10.0000 mg | ORAL_TABLET | Freq: Once | ORAL | Status: AC
  Administered 2020-10-07: 10 mg via ORAL
  Filled 2020-10-07: qty 2

## 2020-10-07 MED ORDER — ONDANSETRON HCL 4 MG PO TABS: 4.0000 mg | ORAL_TABLET | Freq: Every day | ORAL | 1 refills | Status: AC | PRN

## 2020-10-07 MED ORDER — SODIUM CHLORIDE 0.9 % IV SOLN
Freq: Once | INTRAVENOUS | Status: AC
  Administered 2020-10-07: 1000 mL via INTRAVENOUS

## 2020-10-07 MED ORDER — OXYCODONE HCL 5 MG PO TABS: 5.0000 mg | ORAL_TABLET | ORAL | 0 refills | Status: AC | PRN

## 2020-10-07 NOTE — Progress Notes (Signed)
Sent via mail 

## 2020-10-07 NOTE — Discharge Instructions (Signed)

## 2020-10-07 NOTE — Interval H&P Note (Signed)
History and Physical Interval Note:  10/07/2020 8:34 AM  Leah Duncan  has presented today for surgery, with the diagnosis of right ureteral calculus.  The various methods of treatment have been discussed with the patient and family. After consideration of risks, benefits and other options for treatment, the patient has consented to  Procedure(s): EXTRACORPOREAL SHOCK WAVE LITHOTRIPSY (ESWL) (Right) as a surgical intervention.  The patient's history has been reviewed, patient examined, no change in status, stable for surgery.  I have reviewed the patient's chart and labs.  Questions were answered to the patient's satisfaction.     [name]

## 2020-10-08 ENCOUNTER — Encounter (HOSPITAL_COMMUNITY): Payer: Self-pay | Admitting: Urology

## 2020-10-09 NOTE — Progress Notes (Signed)
Letter sent via mail 

## 2020-10-11 DIAGNOSIS — M1710 Unilateral primary osteoarthritis, unspecified knee: Secondary | ICD-10-CM | POA: Diagnosis not present

## 2020-10-11 DIAGNOSIS — I10 Essential (primary) hypertension: Secondary | ICD-10-CM | POA: Diagnosis not present

## 2020-10-11 DIAGNOSIS — E1165 Type 2 diabetes mellitus with hyperglycemia: Secondary | ICD-10-CM | POA: Diagnosis not present

## 2020-10-20 ENCOUNTER — Encounter: Payer: Self-pay | Admitting: Urology

## 2020-10-20 ENCOUNTER — Ambulatory Visit (HOSPITAL_COMMUNITY)
Admission: RE | Admit: 2020-10-20 | Discharge: 2020-10-20 | Disposition: A | Discharge status: Home / Self Care | Payer: Medicare Other | Source: Ambulatory Visit | Attending: Urology | Admitting: Urology

## 2020-10-20 ENCOUNTER — Other Ambulatory Visit: Payer: Self-pay

## 2020-10-20 ENCOUNTER — Ambulatory Visit (INDEPENDENT_AMBULATORY_CARE_PROVIDER_SITE_OTHER): Payer: Medicare Other | Admitting: Urology

## 2020-10-20 VITALS — BP 128/68 | HR 81 | Temp 98.7°F | Ht 60.0 in | Wt 185.0 lb

## 2020-10-20 DIAGNOSIS — N200 Calculus of kidney: Secondary | ICD-10-CM | POA: Diagnosis not present

## 2020-10-20 DIAGNOSIS — Z9889 Other specified postprocedural states: Secondary | ICD-10-CM | POA: Diagnosis not present

## 2020-10-20 LAB — MICROSCOPIC EXAMINATION: Renal Epithel, UA: NONE SEEN /hpf

## 2020-10-20 LAB — URINALYSIS, ROUTINE W REFLEX MICROSCOPIC
Bilirubin, UA: NEGATIVE
Glucose, UA: NEGATIVE
Ketones, UA: NEGATIVE
Nitrite, UA: NEGATIVE
Protein,UA: NEGATIVE
Specific Gravity, UA: 1.02 (ref 1.005–1.030)
Urobilinogen, Ur: 0.2 mg/dL (ref 0.2–1.0)
pH, UA: 6.5 (ref 5.0–7.5)

## 2020-10-20 NOTE — Progress Notes (Signed)

## 2020-10-20 NOTE — Patient Instructions (Signed)

## 2020-10-20 NOTE — Progress Notes (Signed)
10/20/2020 2:18 PM   Leah Duncan August 22, 1955 [id number]  Referring provider: Monico Blitz, MD [address] [place],  [place] [address]  nephrolithiasis  HPI: [name] is a 65yo here for followuop for nephrolithiasis. She had right ESWL 2 weeks. KUB today shows small right proximal ureteral calculus. No flank pain. No LUTS   PMH: Past Medical History:  Diagnosis Date  . Arthritis   . COPD (chronic obstructive pulmonary disease) ([place])   . Depression   . GERD (gastroesophageal reflux disease)   . Headache(784.0)   . Hypertension   . Kidney stones 02/2013  . Shortness of breath    anxiety, exertion    Surgical History: Past Surgical History:  Procedure Laterality Date  . ABDOMINAL HYSTERECTOMY    . CHOLECYSTECTOMY    . EXTRACORPOREAL SHOCK WAVE LITHOTRIPSY Right 04/01/2020   Procedure: EXTRACORPOREAL SHOCK WAVE LITHOTRIPSY (ESWL);  Surgeon: Leah Gustin, MD;  Location: [facility];  Service: Urology;  Laterality: Right;  . EXTRACORPOREAL SHOCK WAVE LITHOTRIPSY Right 10/07/2020   Procedure: EXTRACORPOREAL SHOCK WAVE LITHOTRIPSY (ESWL);  Surgeon: Leah Gustin, MD;  Location: [facility];  Service: Urology;  Laterality: Right;  . HEMORRHOID SURGERY    . KNEE ARTHROSCOPY Bilateral   . TOTAL KNEE ARTHROPLASTY Left 2011  . TOTAL KNEE ARTHROPLASTY Left 05/15/2013   Dr Leah Duncan  . TOTAL KNEE REVISION Left 05/15/2013   Procedure: TOTAL KNEE REVISION;  Surgeon: Leah Balding, MD;  Location: [facility];  Service: Orthopedics;  Laterality: Left;    Home Medications:  Allergies as of 10/20/2020   No Known Allergies     Medication List       Accurate as of Oct 20, 2020  2:18 PM. If you have any questions, ask your nurse or doctor.        albuterol 108 (90 Base) MCG/ACT inhaler Commonly known as: VENTOLIN HFA Inhale 2 puffs into the lungs every 6 (six) hours as needed for wheezing or shortness of breath.   aspirin 81 MG EC tablet Take 81 mg by mouth daily.   gabapentin 100  MG capsule Commonly known as: NEURONTIN Take 100 mg by mouth daily as needed (pain).   Linzess 72 MCG capsule Generic drug: linaclotide Take 72 mcg by mouth daily.   lisinopril 10 MG tablet Commonly known as: ZESTRIL Take 10 mg by mouth daily.   metFORMIN 500 MG tablet Commonly known as: GLUCOPHAGE Take 500 mg by mouth daily.   ondansetron 4 MG tablet Commonly known as: Zofran Take 1 tablet (4 mg total) by mouth daily as needed for nausea or vomiting.   oxyCODONE 5 MG immediate release tablet Commonly known as: Oxy IR/ROXICODONE Take 1-2 tablets (5-10 mg total) by mouth every 4 (four) hours as needed for moderate pain, severe pain or breakthrough pain.   oxyCODONE-acetaminophen 5-325 MG tablet Commonly known as: Percocet Take 1 tablet by mouth every 4 (four) hours as needed. What changed: reasons to take this   pantoprazole 40 MG tablet Commonly known as: PROTONIX Take 40 mg by mouth 2 (two) times daily.   rosuvastatin 10 MG tablet Commonly known as: CRESTOR Take 10 mg by mouth at bedtime.   tamsulosin 0.4 MG Caps capsule Commonly known as: FLOMAX Take 1 capsule (0.4 mg total) by mouth daily after supper.       Allergies: No Known Allergies  Family History: History reviewed. No pertinent family history.  Social History:  reports that she has never smoked. She has never used smokeless  tobacco. She reports that she does not drink alcohol and does not use drugs.  ROS: All other review of systems were reviewed and are negative except what is noted above in HPI  Physical Exam: BP 128/68   Pulse 81   Temp 98.7 F (37.1 C)   Ht 5' (1.524 m)   Wt 185 lb (83.9 kg)   BMI 36.13 kg/m   Constitutional:  Alert and oriented, No acute distress. HEENT: [place] AT, moist mucus membranes.  Trachea midline, no masses. Cardiovascular: No clubbing, cyanosis, or edema. Respiratory: Normal respiratory effort, no increased work of breathing. GI: Abdomen is soft, nontender,  nondistended, no abdominal masses GU: No CVA tenderness.  Lymph: No cervical or inguinal lymphadenopathy. Skin: No rashes, bruises or suspicious lesions. Neurologic: Grossly intact, no focal deficits, moving all 4 extremities. Psychiatric: Normal mood and affect.  Laboratory Data: Lab Results  Component Value Date   WBC 8.4 05/18/2013   HGB 9.4 (L) 05/18/2013   HCT 27.2 (L) 05/18/2013   MCV 88.0 05/18/2013   PLT 132 (L) 05/18/2013    Lab Results  Component Value Date   CREATININE 0.79 05/18/2013    No results found for: PSA  No results found for: TESTOSTERONE  No results found for: HGBA1C  Urinalysis    Component Value Date/Time   COLORURINE YELLOW 05/15/2013 1108   APPEARANCEUR Clear 10/03/2020 1455   LABSPEC 1.013 05/15/2013 1108   PHURINE 7.0 05/15/2013 1108   GLUCOSEU Negative 10/03/2020 1455   HGBUR NEGATIVE 05/15/2013 1108   BILIRUBINUR Negative 10/03/2020 1455   KETONESUR NEGATIVE 05/15/2013 1108   PROTEINUR 1+ (A) 10/03/2020 1455   PROTEINUR NEGATIVE 05/15/2013 1108   UROBILINOGEN 0.2 05/15/2013 1108   NITRITE Negative 10/03/2020 1455   NITRITE NEGATIVE 05/15/2013 1108   LEUKOCYTESUR 1+ (A) 10/03/2020 1455    Lab Results  Component Value Date   LABMICR See below: 10/03/2020   WBCUA 11-30 (A) 10/03/2020   LABEPIT >10 (A) 10/03/2020   MUCUS Present 09/08/2020   BACTERIA Many (A) 10/03/2020    Pertinent Imaging: KUB today: Images reviewed and discussed with the patient Results for orders placed during the hospital encounter of 10/07/20  DG Abd 1 View  Narrative CLINICAL DATA:  Ureteral calculus  EXAM: ABDOMEN - 1 VIEW  COMPARISON:  10/03/2020  FINDINGS: Stable 8 mm calculus within the right paraspinal region at the level of L4-5 compatible with a proximal ureteral calculus. Grossly stable punctate calculi overlying the right kidney andl multiple calculi overlying the left kidney measuring up to 5 mm in keeping with bilateral  nephrolithiasis. Stable probable phlebolith within the left hemipelvis.  Normal abdominal gas pattern. Cholecystectomy clips seen in the right upper quadrant. No organomegaly. L4-5 lumbar fusion with instrumentation has been performed.  IMPRESSION: Stable 8 mm proximal right ureteral calculus. Grossly stable bilateral nephrolithiasis.   Electronically Signed By: Fidela Salisbury MD On: 10/07/2020 23:55  No results found for this or any previous visit.  No results found for this or any previous visit.  No results found for this or any previous visit.  No results found for this or any previous visit.  No results found for this or any previous visit.  No results found for this or any previous visit.  No results found for this or any previous visit.   Assessment & Plan:    1. Kidney stones -RTC 1 month with KUB - Urinalysis, Routine w reflex microscopic   Return in about 4 weeks (around 11/17/2020) for  KUB.  [name], MD  [facility] [place]

## 2020-11-01 ENCOUNTER — Other Ambulatory Visit: Payer: Self-pay | Admitting: Urology

## 2020-11-17 DIAGNOSIS — I10 Essential (primary) hypertension: Secondary | ICD-10-CM | POA: Diagnosis not present

## 2020-11-17 DIAGNOSIS — Z299 Encounter for prophylactic measures, unspecified: Secondary | ICD-10-CM | POA: Diagnosis not present

## 2020-11-17 DIAGNOSIS — E1165 Type 2 diabetes mellitus with hyperglycemia: Secondary | ICD-10-CM | POA: Diagnosis not present

## 2020-11-26 ENCOUNTER — Ambulatory Visit: Payer: Medicare Other | Admitting: Urology

## 2020-12-11 ENCOUNTER — Other Ambulatory Visit: Payer: Self-pay | Admitting: Internal Medicine

## 2020-12-11 DIAGNOSIS — Z139 Encounter for screening, unspecified: Secondary | ICD-10-CM

## 2020-12-12 ENCOUNTER — Ambulatory Visit
Admission: RE | Admit: 2020-12-12 | Discharge: 2020-12-12 | Disposition: A | Discharge status: Home / Self Care | Payer: Medicare Other | Source: Ambulatory Visit | Attending: Internal Medicine | Admitting: Internal Medicine

## 2020-12-12 ENCOUNTER — Other Ambulatory Visit: Payer: Self-pay

## 2020-12-12 DIAGNOSIS — Z139 Encounter for screening, unspecified: Secondary | ICD-10-CM

## 2020-12-12 DIAGNOSIS — Z1231 Encounter for screening mammogram for malignant neoplasm of breast: Secondary | ICD-10-CM | POA: Diagnosis not present

## 2020-12-16 ENCOUNTER — Other Ambulatory Visit: Payer: Self-pay | Admitting: Urology

## 2021-01-11 DIAGNOSIS — E1165 Type 2 diabetes mellitus with hyperglycemia: Secondary | ICD-10-CM | POA: Diagnosis not present

## 2021-01-11 DIAGNOSIS — I10 Essential (primary) hypertension: Secondary | ICD-10-CM | POA: Diagnosis not present

## 2021-01-11 DIAGNOSIS — M1710 Unilateral primary osteoarthritis, unspecified knee: Secondary | ICD-10-CM | POA: Diagnosis not present

## 2021-02-18 DIAGNOSIS — Z299 Encounter for prophylactic measures, unspecified: Secondary | ICD-10-CM | POA: Diagnosis not present

## 2021-02-18 DIAGNOSIS — E1165 Type 2 diabetes mellitus with hyperglycemia: Secondary | ICD-10-CM | POA: Diagnosis not present

## 2021-02-18 DIAGNOSIS — I10 Essential (primary) hypertension: Secondary | ICD-10-CM | POA: Diagnosis not present

## 2021-02-18 DIAGNOSIS — K219 Gastro-esophageal reflux disease without esophagitis: Secondary | ICD-10-CM | POA: Diagnosis not present

## 2021-02-23 ENCOUNTER — Ambulatory Visit: Payer: Medicare Other | Admitting: Urology

## 2021-03-09 ENCOUNTER — Other Ambulatory Visit: Payer: Self-pay

## 2021-03-09 ENCOUNTER — Ambulatory Visit (HOSPITAL_COMMUNITY)
Admission: RE | Admit: 2021-03-09 | Discharge: 2021-03-09 | Disposition: A | Discharge status: Home / Self Care | Payer: Medicare Other | Source: Ambulatory Visit | Attending: Urology | Admitting: Urology

## 2021-03-09 ENCOUNTER — Encounter: Payer: Self-pay | Admitting: Urology

## 2021-03-09 ENCOUNTER — Ambulatory Visit (INDEPENDENT_AMBULATORY_CARE_PROVIDER_SITE_OTHER): Payer: Medicare Other | Admitting: Urology

## 2021-03-09 VITALS — BP 127/63 | HR 62

## 2021-03-09 DIAGNOSIS — N200 Calculus of kidney: Secondary | ICD-10-CM | POA: Insufficient documentation

## 2021-03-09 DIAGNOSIS — Z981 Arthrodesis status: Secondary | ICD-10-CM | POA: Diagnosis not present

## 2021-03-09 DIAGNOSIS — N3000 Acute cystitis without hematuria: Secondary | ICD-10-CM | POA: Diagnosis not present

## 2021-03-09 DIAGNOSIS — R1031 Right lower quadrant pain: Secondary | ICD-10-CM | POA: Diagnosis not present

## 2021-03-09 MED ORDER — NITROFURANTOIN MONOHYD MACRO 100 MG PO CAPS: 100.0000 mg | ORAL_CAPSULE | Freq: Two times a day (BID) | ORAL | 0 refills | Status: AC

## 2021-03-09 NOTE — Progress Notes (Signed)
03/09/2021 2:41 PM   KENNEDI LIZARDO December 24, 1955 [id number]  Referring provider: Monico Blitz, MD [address] [place],  [place] [address]  nephrolithiasis   HPI: Ms Leah Duncan is a [phone] here for followup for nephrolithiasis. She has been having right flank pain for 1 month. The pain is sharp, intermittent, moderate and nonraditing. She has associated urinary urgency, frequency and dysuria. UA today is concerning for infection. KUB from today shows left renal calculi but no right renal or ureteral calculi. No other complaints today.    PMH: Past Medical History:  Diagnosis Date   Arthritis    COPD (chronic obstructive pulmonary disease) ([place])    Depression    GERD (gastroesophageal reflux disease)    Headache(784.0)    Hypertension    Kidney stones 02/2013   Shortness of breath    anxiety, exertion    Surgical History: Past Surgical History:  Procedure Laterality Date   ABDOMINAL HYSTERECTOMY     CHOLECYSTECTOMY     EXTRACORPOREAL SHOCK WAVE LITHOTRIPSY Right 04/01/2020   Procedure: EXTRACORPOREAL SHOCK WAVE LITHOTRIPSY (ESWL);  Surgeon: Cleon Gustin, MD;  Location: [facility];  Service: Urology;  Laterality: Right;   EXTRACORPOREAL SHOCK WAVE LITHOTRIPSY Right 10/07/2020   Procedure: EXTRACORPOREAL SHOCK WAVE LITHOTRIPSY (ESWL);  Surgeon: Cleon Gustin, MD;  Location: [facility];  Service: Urology;  Laterality: Right;   HEMORRHOID SURGERY     KNEE ARTHROSCOPY Bilateral    TOTAL KNEE ARTHROPLASTY Left 2011   TOTAL KNEE ARTHROPLASTY Left 05/15/2013   Dr Durward Fortes   TOTAL KNEE REVISION Left 05/15/2013   Procedure: TOTAL KNEE REVISION;  Surgeon: Garald Balding, MD;  Location: [facility];  Service: Orthopedics;  Laterality: Left;    Home Medications:  Allergies as of 03/09/2021   No Known Allergies      Medication List        Accurate as of March 09, 2021  2:41 PM. If you have any questions, ask your nurse or doctor.          albuterol 108 (90 Base) MCG/ACT  inhaler Commonly known as: VENTOLIN HFA Inhale 2 puffs into the lungs every 6 (six) hours as needed for wheezing or shortness of breath.   aspirin 81 MG EC tablet Take 81 mg by mouth daily.   escitalopram 10 MG tablet Commonly known as: LEXAPRO Take 10 mg by mouth daily.   gabapentin 100 MG capsule Commonly known as: NEURONTIN Take 100 mg by mouth daily as needed (pain).   Linzess 72 MCG capsule Generic drug: linaclotide Take 72 mcg by mouth daily.   lisinopril 10 MG tablet Commonly known as: ZESTRIL Take 10 mg by mouth daily.   metFORMIN 500 MG tablet Commonly known as: GLUCOPHAGE Take 500 mg by mouth daily.   omeprazole 40 MG capsule Commonly known as: PRILOSEC Take 40 mg by mouth 2 (two) times daily.   ondansetron 4 MG tablet Commonly known as: Zofran Take 1 tablet (4 mg total) by mouth daily as needed for nausea or vomiting.   oxyCODONE 5 MG immediate release tablet Commonly known as: Oxy IR/ROXICODONE Take 1-2 tablets (5-10 mg total) by mouth every 4 (four) hours as needed for moderate pain, severe pain or breakthrough pain.   oxyCODONE-acetaminophen 5-325 MG tablet Commonly known as: Percocet Take 1 tablet by mouth every 4 (four) hours as needed. What changed: reasons to take this   pantoprazole 40 MG tablet Commonly known as: PROTONIX Take 40 mg by mouth 2 (two) times daily.  rosuvastatin 10 MG tablet Commonly known as: CRESTOR Take 10 mg by mouth at bedtime.   tamsulosin 0.4 MG Caps capsule Commonly known as: FLOMAX TAKE 1 CAPSULE (0.4 MG TOTAL) BY MOUTH DAILY AFTER SUPPER.        Allergies: No Known Allergies  Family History: Family History  Problem Relation Age of Onset   Breast cancer Paternal Aunt     Social History:  reports that she has never smoked. She has never used smokeless tobacco. She reports that she does not drink alcohol and does not use drugs.  ROS: All other review of systems were reviewed and are negative except what  is noted above in HPI  Physical Exam: BP 127/63   Pulse 62   Constitutional:  Alert and oriented, No acute distress. HEENT: [place] AT, moist mucus membranes.  Trachea midline, no masses. Cardiovascular: No clubbing, cyanosis, or edema. Respiratory: Normal respiratory effort, no increased work of breathing. GI: Abdomen is soft, nontender, nondistended, no abdominal masses GU: No CVA tenderness.  Lymph: No cervical or inguinal lymphadenopathy. Skin: No rashes, bruises or suspicious lesions. Neurologic: Grossly intact, no focal deficits, moving all 4 extremities. Psychiatric: Normal mood and affect.  Laboratory Data: Lab Results  Component Value Date   WBC 8.4 05/18/2013   HGB 9.4 (L) 05/18/2013   HCT 27.2 (L) 05/18/2013   MCV 88.0 05/18/2013   PLT 132 (L) 05/18/2013    Lab Results  Component Value Date   CREATININE 0.79 05/18/2013    No results found for: PSA  No results found for: TESTOSTERONE  No results found for: HGBA1C  Urinalysis    Component Value Date/Time   COLORURINE YELLOW 05/15/2013 1108   APPEARANCEUR Clear 10/20/2020 1354   LABSPEC 1.013 05/15/2013 1108   PHURINE 7.0 05/15/2013 1108   GLUCOSEU Negative 10/20/2020 1354   HGBUR NEGATIVE 05/15/2013 1108   BILIRUBINUR Negative 10/20/2020 1354   KETONESUR NEGATIVE 05/15/2013 1108   PROTEINUR Negative 10/20/2020 1354   PROTEINUR NEGATIVE 05/15/2013 1108   UROBILINOGEN 0.2 05/15/2013 1108   NITRITE Negative 10/20/2020 1354   NITRITE NEGATIVE 05/15/2013 1108   LEUKOCYTESUR 1+ (A) 10/20/2020 1354    Lab Results  Component Value Date   LABMICR See below: 10/20/2020   WBCUA 6-10 (A) 10/20/2020   LABEPIT 0-10 10/20/2020   MUCUS Present 09/08/2020   BACTERIA Few 10/20/2020    Pertinent Imaging: KUB today: Images reviewed and discussed with the patient Results for orders placed during the hospital encounter of 10/20/20  DG Abd 1 View  Narrative CLINICAL DATA:  Status post lithotripsy.  Recent  nephrolithiasis  EXAM: ABDOMEN - 1 VIEW  COMPARISON:  October 07, 2020  FINDINGS: The previously noted calculus on the right at the L5 level is no longer appreciable by radiography. There are multiple intrarenal calculi bilaterally which appear stable. Calculi on the right range in size from 1 mm to 3 mm. Calculi throughout the left kidney range in size from as small as 1 mm to as large as 6 mm, stable. No new calcifications are evident.  Postoperative change noted in the lower lumbar and upper sacral region. There is moderate stool in the colon. There is no bowel dilatation or air-fluid level to suggest bowel obstruction. Surgical clips noted in the right upper abdomen. Lung bases clear.  IMPRESSION: Stable intrarenal calculi bilaterally. The calculus seen previously to the right of L4, presumably in the right ureter is not appreciable by radiography at this time.  No bowel obstruction or free  air evident.   Electronically Signed By: Lowella Grip III M.D. On: 10/21/2020 11:02  No results found for this or any previous visit.  No results found for this or any previous visit.  No results found for this or any previous visit.  No results found for this or any previous visit.  No results found for this or any previous visit.  No results found for this or any previous visit.  No results found for this or any previous visit.   Assessment & Plan:    1. Kidney stones -RTC 1 month Ct stone  2. Acute cystitis -Urine for culture -macrobid 100mg  BID for 7 days   No follow-ups on file.  [name], MD  [facility] [place]

## 2021-03-09 NOTE — Patient Instructions (Signed)
Dietary Guidelines to Help Prevent Kidney Stones Kidney stones are deposits of minerals and salts that form inside your kidneys. Your risk of developing kidney stones may be greater depending on your diet, your lifestyle, the medicines you take, and whether you have certain medical conditions. Most people can lower their chances of developing kidney stones by following the instructions below. Your dietitian may give you more specific instructions depending on your overall health and the type of kidney stones you tend to develop. What are tips for following this plan? Reading food labels  Choose foods with "no salt added" or "low-salt" labels. Limit your salt (sodium) intake to less than 1,500 mg a day. Choose foods with calcium for each meal and snack. Try to eat about 300 mg of calcium at each meal. Foods that contain 200-500 mg of calcium a serving include: 8 oz (237 mL) of milk, calcium-fortifiednon-dairy milk, and calcium-fortifiedfruit juice. Calcium-fortified means that calcium has been added to these drinks. 8 oz (237 mL) of kefir, yogurt, and soy yogurt. 4 oz (114 g) of tofu. 1 oz (28 g) of cheese. 1 cup (150 g) of dried figs. 1 cup (91 g) of cooked broccoli. One 3 oz (85 g) can of sardines or mackerel. Most people need 1,000-1,500 mg of calcium a day. Talk to your dietitian about how much calcium is recommended for you. Shopping Buy plenty of fresh fruits and vegetables. Most people do not need to avoid fruits and vegetables, even if these foods contain nutrients that may contribute to kidney stones. When shopping for convenience foods, choose: Whole pieces of fruit. Pre-made salads with dressing on the side. Low-fat fruit and yogurt smoothies. Avoid buying frozen meals or prepared deli foods. These can be high in sodium. Look for foods with live cultures, such as yogurt and kefir. Choose high-fiber grains, such as whole-wheat breads, oat bran, and wheat cereals. Cooking Do not add  salt to food when cooking. Place a salt shaker on the table and allow each person to add his or her own salt to taste. Use vegetable protein, such as beans, textured vegetable protein (TVP), or tofu, instead of meat in pasta, casseroles, and soups. Meal planning Eat less salt, if told by your dietitian. To do this: Avoid eating processed or pre-made food. Avoid eating fast food. Eat less animal protein, including cheese, meat, poultry, or fish, if told by your dietitian. To do this: Limit the number of times you have meat, poultry, fish, or cheese each week. Eat a diet free of meat at least 2 days a week. Eat only one serving each day of meat, poultry, fish, or seafood. When you prepare animal protein, cut pieces into small portion sizes. For most meat and fish, one serving is about the size of the palm of your hand. Eat at least five servings of fresh fruits and vegetables each day. To do this: Keep fruits and vegetables on hand for snacks. Eat one piece of fruit or a handful of berries with breakfast. Have a salad and fruit at lunch. Have two kinds of vegetables at dinner. Limit foods that are high in a substance called oxalate. These include: Spinach (cooked), rhubarb, beets, sweet potatoes, and Swiss chard. Peanuts. Potato chips, french fries, and baked potatoes with skin on. Nuts and nut products. Chocolate. If you regularly take a diuretic medicine, make sure to eat at least 1 or 2 servings of fruits or vegetables that are high in potassium each day. These include: Avocado. Banana. Orange, prune,   carrot, or tomato juice. Baked potato. Cabbage. Beans and split peas. Lifestyle  Drink enough fluid to keep your urine pale yellow. This is the most important thing you can do. Spread your fluid intake throughout the day. If you drink alcohol: Limit how much you use to: 0-1 drink a day for women who are not pregnant. 0-2 drinks a day for men. Be aware of how much alcohol is in your  drink. In the [place] one drink equals one 12 oz bottle of beer (355 mL), one 5 oz glass of wine (148 mL), or one 1 oz glass of hard liquor (44 mL). Lose weight if told by your health care provider. Work with your dietitian to find an eating plan and weight loss strategies that work best for you. General information Talk to your health care provider and dietitian about taking daily supplements. You may be told the following depending on your health and the cause of your kidney stones: Not to take supplements with vitamin C. To take a calcium supplement. To take a daily probiotic supplement. To take other supplements such as magnesium, fish oil, or vitamin B6. Take over-the-counter and prescription medicines only as told by your health care provider. These include supplements. What foods should I limit? Limit your intake of the following foods, or eat them as told by your dietitian. Vegetables Spinach. Rhubarb. Beets. Canned vegetables. Pickles. Olives. Baked potatoes with skin. Grains Wheat bran. Baked goods. Salted crackers. Cereals high in sugar. Meats and other proteins Nuts. Nut butters. Large portions of meat, poultry, or fish. Salted, precooked, or cured meats, such as sausages, meat loaves, and hot dogs. Dairy Cheese. Beverages Regular soft drinks. Regular vegetable juice. Seasonings and condiments Seasoning blends with salt. Salad dressings. Soy sauce. Ketchup. Barbecue sauce. Other foods Canned soups. Canned pasta sauce. Casseroles. Pizza. Lasagna. Frozen meals. Potato chips. French fries. The items listed above may not be a complete list of foods and beverages you should limit. Contact a dietitian for more information. What foods should I avoid? Talk to your dietitian about specific foods you should avoid based on the type of kidney stones you have and your overall health. Fruits Grapefruit. The item listed above may not be a complete list of foods and beverages you should  avoid. Contact a dietitian for more information. Summary Kidney stones are deposits of minerals and salts that form inside your kidneys. You can lower your risk of kidney stones by making changes to your diet. The most important thing you can do is drink enough fluid. Drink enough fluid to keep your urine pale yellow. Talk to your dietitian about how much calcium you should have each day, and eat less salt and animal protein as told by your dietitian. This information is not intended to replace advice given to you by your health care provider. Make sure you discuss any questions you have with your health care provider. Document Revised: 05/24/2019 Document Reviewed: 05/24/2019 Elsevier Patient Education  2022 [employer]  

## 2021-03-09 NOTE — Addendum Note (Signed)
Addended byIris Pert on: 03/09/2021 05:02 PM   Modules accepted: Orders

## 2021-03-10 LAB — MICROSCOPIC EXAMINATION
Epithelial Cells (non renal): >10 /hpf — AB (ref 0–10)
Renal Epithel, UA: NONE SEEN /hpf

## 2021-03-10 LAB — URINALYSIS, ROUTINE W REFLEX MICROSCOPIC
Bilirubin, UA: NEGATIVE
Glucose, UA: NEGATIVE
Ketones, UA: NEGATIVE
Nitrite, UA: NEGATIVE
Specific Gravity, UA: 1.02 (ref 1.005–1.030)
Urobilinogen, Ur: 0.2 mg/dL (ref 0.2–1.0)
pH, UA: 5.5 (ref 5.0–7.5)

## 2021-03-11 LAB — URINE CULTURE

## 2021-03-20 ENCOUNTER — Telehealth: Payer: Self-pay

## 2021-03-20 ENCOUNTER — Other Ambulatory Visit: Payer: Self-pay | Admitting: Urology

## 2021-03-20 NOTE — Telephone Encounter (Signed)
Called informed advised pt of her lab results.

## 2021-03-20 NOTE — Telephone Encounter (Signed)
-----   Message from Cleon Gustin, MD sent at 03/16/2021 10:24 AM EDT ----- negative ----- Message ----- From: Dorisann Frames, RN Sent: 03/11/2021   9:07 AM EDT To: Cleon Gustin, MD  Please review

## 2021-04-07 ENCOUNTER — Ambulatory Visit (HOSPITAL_COMMUNITY): Admission: RE | Admit: 2021-04-07 | Payer: Medicare Other | Source: Ambulatory Visit

## 2021-04-10 ENCOUNTER — Ambulatory Visit: Payer: Medicare Other | Admitting: Urology

## 2021-04-13 DIAGNOSIS — E1165 Type 2 diabetes mellitus with hyperglycemia: Secondary | ICD-10-CM | POA: Diagnosis not present

## 2021-04-13 DIAGNOSIS — I10 Essential (primary) hypertension: Secondary | ICD-10-CM | POA: Diagnosis not present

## 2021-04-13 DIAGNOSIS — M1710 Unilateral primary osteoarthritis, unspecified knee: Secondary | ICD-10-CM | POA: Diagnosis not present

## 2021-06-12 DIAGNOSIS — E119 Type 2 diabetes mellitus without complications: Secondary | ICD-10-CM | POA: Diagnosis not present

## 2021-06-22 DIAGNOSIS — Z7189 Other specified counseling: Secondary | ICD-10-CM | POA: Diagnosis not present

## 2021-06-22 DIAGNOSIS — I10 Essential (primary) hypertension: Secondary | ICD-10-CM | POA: Diagnosis not present

## 2021-06-22 DIAGNOSIS — Z299 Encounter for prophylactic measures, unspecified: Secondary | ICD-10-CM | POA: Diagnosis not present

## 2021-06-22 DIAGNOSIS — E7800 Pure hypercholesterolemia, unspecified: Secondary | ICD-10-CM | POA: Diagnosis not present

## 2021-06-22 DIAGNOSIS — Z789 Other specified health status: Secondary | ICD-10-CM | POA: Diagnosis not present

## 2021-06-22 DIAGNOSIS — Z0000 Encounter for general adult medical examination without abnormal findings: Secondary | ICD-10-CM | POA: Diagnosis not present

## 2021-06-22 DIAGNOSIS — Z79899 Other long term (current) drug therapy: Secondary | ICD-10-CM | POA: Diagnosis not present

## 2021-06-22 DIAGNOSIS — R5383 Other fatigue: Secondary | ICD-10-CM | POA: Diagnosis not present

## 2021-06-22 DIAGNOSIS — E1165 Type 2 diabetes mellitus with hyperglycemia: Secondary | ICD-10-CM | POA: Diagnosis not present

## 2021-07-07 DIAGNOSIS — D239 Other benign neoplasm of skin, unspecified: Secondary | ICD-10-CM | POA: Diagnosis not present

## 2021-07-07 DIAGNOSIS — B079 Viral wart, unspecified: Secondary | ICD-10-CM | POA: Diagnosis not present

## 2021-07-07 DIAGNOSIS — D485 Neoplasm of uncertain behavior of skin: Secondary | ICD-10-CM | POA: Diagnosis not present

## 2021-07-14 DIAGNOSIS — I10 Essential (primary) hypertension: Secondary | ICD-10-CM | POA: Diagnosis not present

## 2021-07-14 DIAGNOSIS — E119 Type 2 diabetes mellitus without complications: Secondary | ICD-10-CM | POA: Diagnosis not present

## 2021-07-23 DIAGNOSIS — B079 Viral wart, unspecified: Secondary | ICD-10-CM | POA: Diagnosis not present

## 2021-07-23 DIAGNOSIS — D485 Neoplasm of uncertain behavior of skin: Secondary | ICD-10-CM | POA: Diagnosis not present

## 2021-09-24 DIAGNOSIS — Z713 Dietary counseling and surveillance: Secondary | ICD-10-CM | POA: Diagnosis not present

## 2021-09-24 DIAGNOSIS — E1165 Type 2 diabetes mellitus with hyperglycemia: Secondary | ICD-10-CM | POA: Diagnosis not present

## 2021-09-24 DIAGNOSIS — Z299 Encounter for prophylactic measures, unspecified: Secondary | ICD-10-CM | POA: Diagnosis not present

## 2021-09-24 DIAGNOSIS — I10 Essential (primary) hypertension: Secondary | ICD-10-CM | POA: Diagnosis not present

## 2021-10-19 DIAGNOSIS — E1142 Type 2 diabetes mellitus with diabetic polyneuropathy: Secondary | ICD-10-CM | POA: Diagnosis not present

## 2021-10-19 DIAGNOSIS — E1129 Type 2 diabetes mellitus with other diabetic kidney complication: Secondary | ICD-10-CM | POA: Diagnosis not present

## 2021-10-19 DIAGNOSIS — R809 Proteinuria, unspecified: Secondary | ICD-10-CM | POA: Diagnosis not present

## 2021-10-19 DIAGNOSIS — Z789 Other specified health status: Secondary | ICD-10-CM | POA: Diagnosis not present

## 2021-10-19 DIAGNOSIS — Z299 Encounter for prophylactic measures, unspecified: Secondary | ICD-10-CM | POA: Diagnosis not present

## 2021-10-19 DIAGNOSIS — J069 Acute upper respiratory infection, unspecified: Secondary | ICD-10-CM | POA: Diagnosis not present

## 2021-11-03 DIAGNOSIS — E1129 Type 2 diabetes mellitus with other diabetic kidney complication: Secondary | ICD-10-CM | POA: Diagnosis not present

## 2021-11-03 DIAGNOSIS — R0602 Shortness of breath: Secondary | ICD-10-CM | POA: Diagnosis not present

## 2021-11-03 DIAGNOSIS — E1142 Type 2 diabetes mellitus with diabetic polyneuropathy: Secondary | ICD-10-CM | POA: Diagnosis not present

## 2021-11-03 DIAGNOSIS — E1165 Type 2 diabetes mellitus with hyperglycemia: Secondary | ICD-10-CM | POA: Diagnosis not present

## 2021-11-03 DIAGNOSIS — J069 Acute upper respiratory infection, unspecified: Secondary | ICD-10-CM | POA: Diagnosis not present

## 2021-11-03 DIAGNOSIS — R059 Cough, unspecified: Secondary | ICD-10-CM | POA: Diagnosis not present

## 2021-11-03 DIAGNOSIS — Z789 Other specified health status: Secondary | ICD-10-CM | POA: Diagnosis not present

## 2021-11-03 DIAGNOSIS — Z299 Encounter for prophylactic measures, unspecified: Secondary | ICD-10-CM | POA: Diagnosis not present

## 2021-11-25 DIAGNOSIS — E119 Type 2 diabetes mellitus without complications: Secondary | ICD-10-CM | POA: Diagnosis not present

## 2021-12-29 DIAGNOSIS — I10 Essential (primary) hypertension: Secondary | ICD-10-CM | POA: Diagnosis not present

## 2021-12-29 DIAGNOSIS — E1165 Type 2 diabetes mellitus with hyperglycemia: Secondary | ICD-10-CM | POA: Diagnosis not present

## 2021-12-29 DIAGNOSIS — Z789 Other specified health status: Secondary | ICD-10-CM | POA: Diagnosis not present

## 2021-12-29 DIAGNOSIS — Z299 Encounter for prophylactic measures, unspecified: Secondary | ICD-10-CM | POA: Diagnosis not present

## 2022-01-20 ENCOUNTER — Other Ambulatory Visit: Payer: Self-pay | Admitting: Internal Medicine

## 2022-01-20 DIAGNOSIS — Z1231 Encounter for screening mammogram for malignant neoplasm of breast: Secondary | ICD-10-CM

## 2022-02-24 ENCOUNTER — Ambulatory Visit
Admission: RE | Admit: 2022-02-24 | Discharge: 2022-02-24 | Disposition: A | Discharge status: Home / Self Care | Payer: Medicare Other | Source: Ambulatory Visit | Attending: Internal Medicine | Admitting: Internal Medicine

## 2022-02-24 DIAGNOSIS — Z1231 Encounter for screening mammogram for malignant neoplasm of breast: Secondary | ICD-10-CM | POA: Diagnosis not present

## 2022-04-06 DIAGNOSIS — J441 Chronic obstructive pulmonary disease with (acute) exacerbation: Secondary | ICD-10-CM | POA: Diagnosis not present

## 2022-04-06 DIAGNOSIS — E1165 Type 2 diabetes mellitus with hyperglycemia: Secondary | ICD-10-CM | POA: Diagnosis not present

## 2022-04-06 DIAGNOSIS — Z299 Encounter for prophylactic measures, unspecified: Secondary | ICD-10-CM | POA: Diagnosis not present

## 2022-04-06 DIAGNOSIS — I10 Essential (primary) hypertension: Secondary | ICD-10-CM | POA: Diagnosis not present

## 2022-04-10 IMAGING — DX DG ABDOMEN 1V
2 series · 2 of 2 positions shown · non-contrast
Comparison: 04/18/2020

CLINICAL DATA: Nephrolithiasis

EXAM:
ABDOMEN - 1 VIEW

[abdomen kub (1 of 2)]
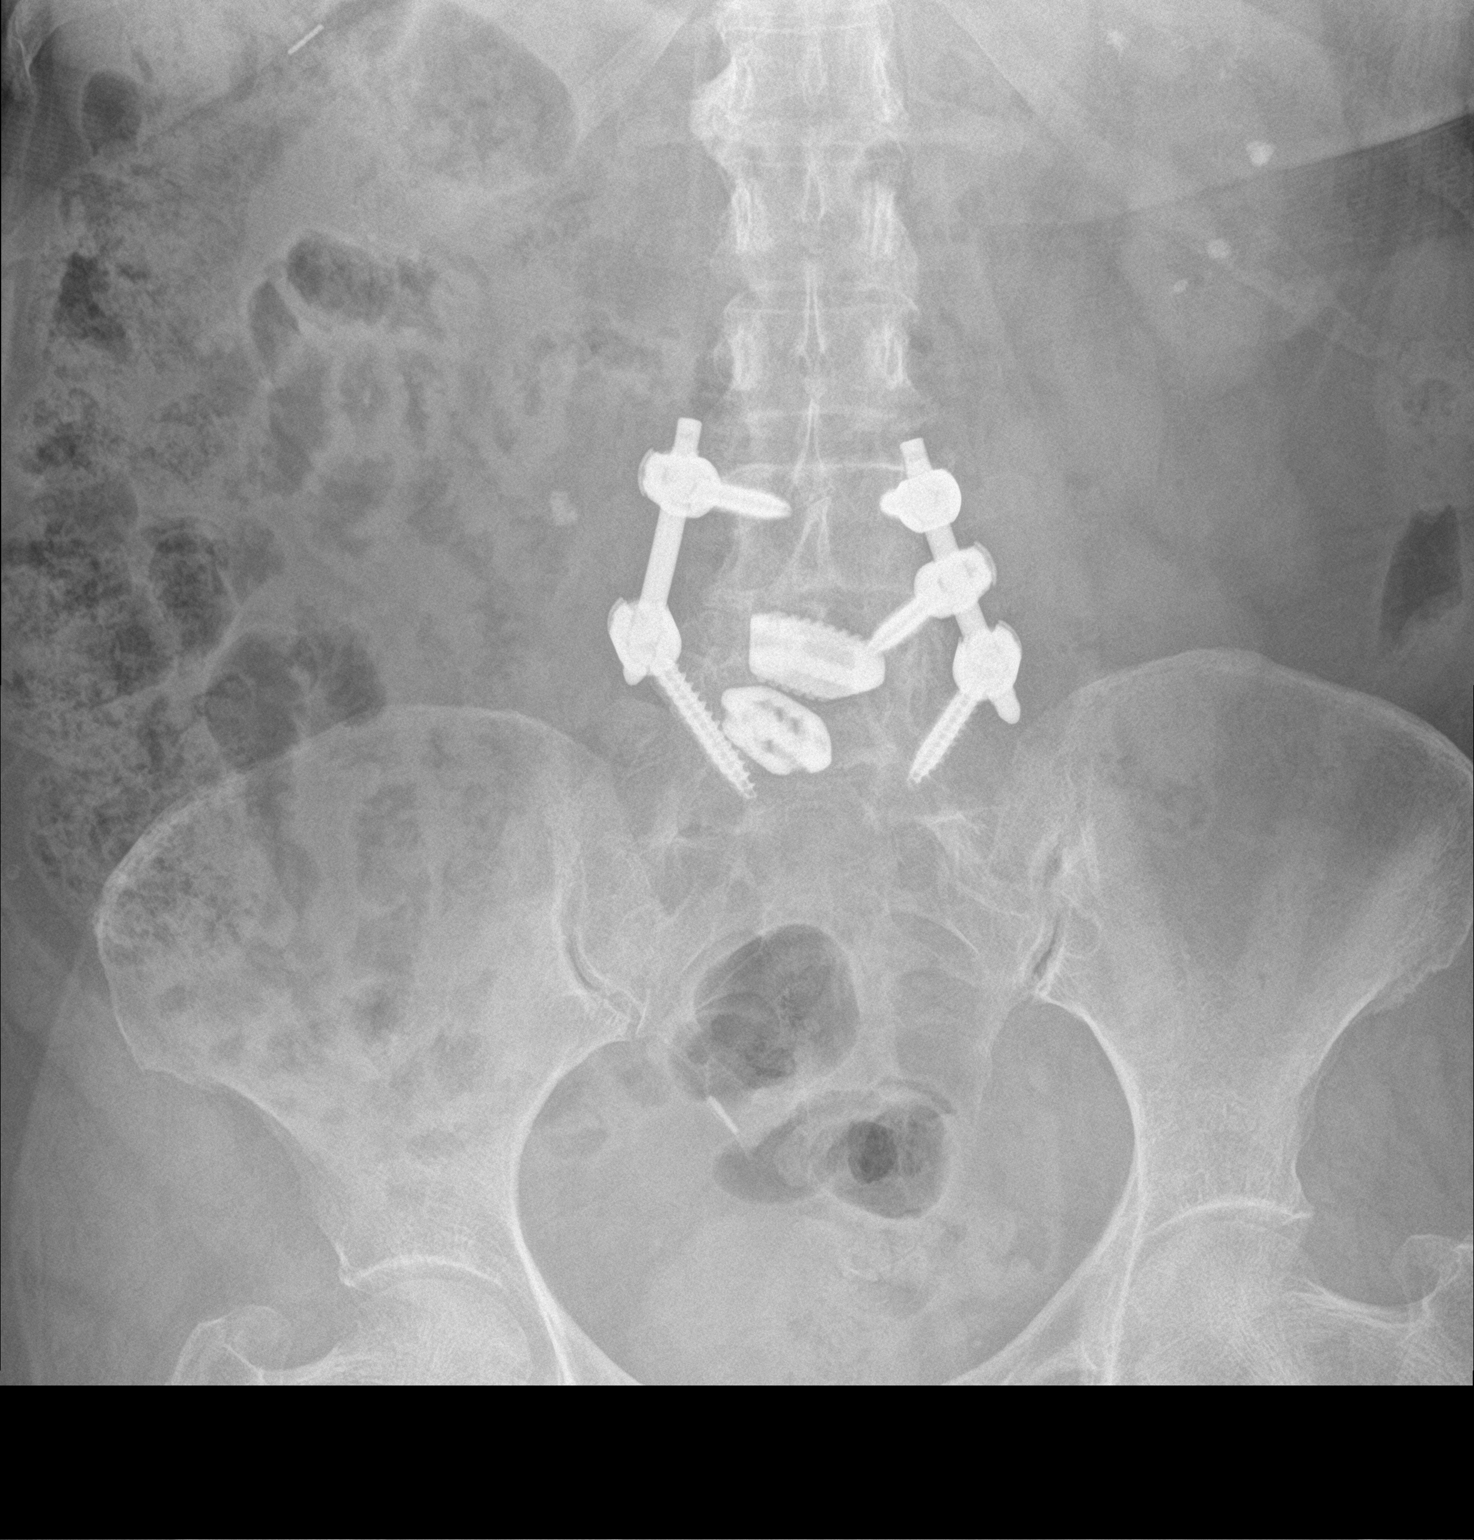

[abdomen kub (2 of 2)]
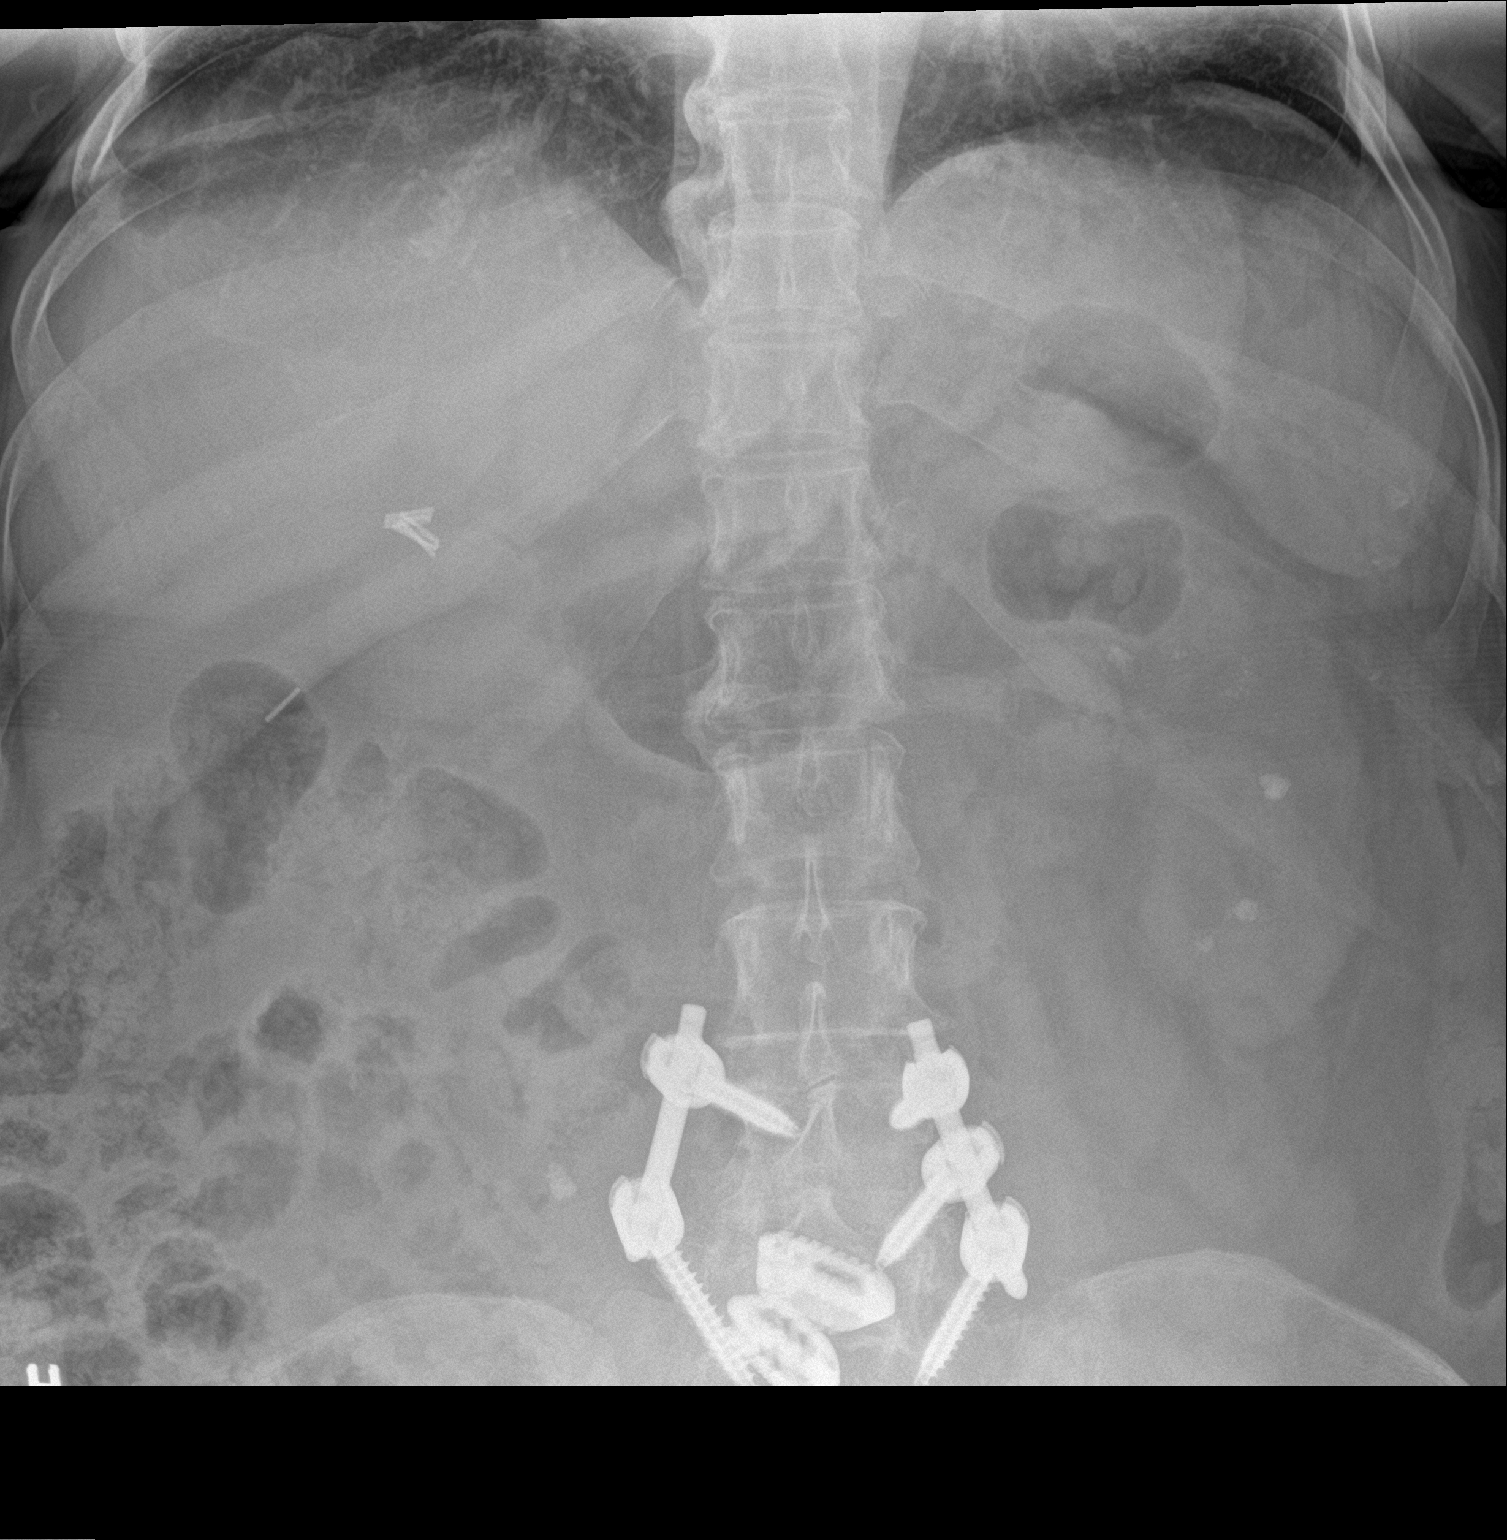

[2 of 2 positions shown; findings below may reference images not displayed]

FINDINGS: There are at least 6 separate calcifications overlying the left
kidney, similar to prior examination, measuring up to 7 mm in
greatest diameter. Previously noted dominant calculus within the
expected lower pole of the right kidney has now migrated medially
and inferiorly, likely into the proximal right ureter and is seen
right S1 pedicle screw, measuring 6 mm x 8 mm.

Cholecystectomy clips are seen in the right upper quadrant. No
organomegaly. Normal abdominal gas pattern. Lumbosacral fusion with
instrumentation has been performed.
IMPRESSION: Stable moderate left nephrolithiasis.

Interval passage of right renal calculus likely into the proximal
right ureter. This calculus measures 6 x 8 mm. This can be confirmed
with CT imaging.

## 2022-04-19 DIAGNOSIS — J441 Chronic obstructive pulmonary disease with (acute) exacerbation: Secondary | ICD-10-CM | POA: Diagnosis not present

## 2022-04-19 DIAGNOSIS — Z713 Dietary counseling and surveillance: Secondary | ICD-10-CM | POA: Diagnosis not present

## 2022-04-19 DIAGNOSIS — Z299 Encounter for prophylactic measures, unspecified: Secondary | ICD-10-CM | POA: Diagnosis not present

## 2022-04-19 DIAGNOSIS — R059 Cough, unspecified: Secondary | ICD-10-CM | POA: Diagnosis not present

## 2022-04-26 DIAGNOSIS — I10 Essential (primary) hypertension: Secondary | ICD-10-CM | POA: Diagnosis not present

## 2022-04-26 DIAGNOSIS — Z299 Encounter for prophylactic measures, unspecified: Secondary | ICD-10-CM | POA: Diagnosis not present

## 2022-04-26 DIAGNOSIS — G4700 Insomnia, unspecified: Secondary | ICD-10-CM | POA: Diagnosis not present

## 2022-05-05 IMAGING — DX DG ABDOMEN 1V
2 series · 2 of 2 positions shown · non-contrast
Comparison: September 08, 2020

CLINICAL DATA: Nephrolithiasis.

EXAM:
ABDOMEN - 1 VIEW

[abdomen kub (1 of 2)]
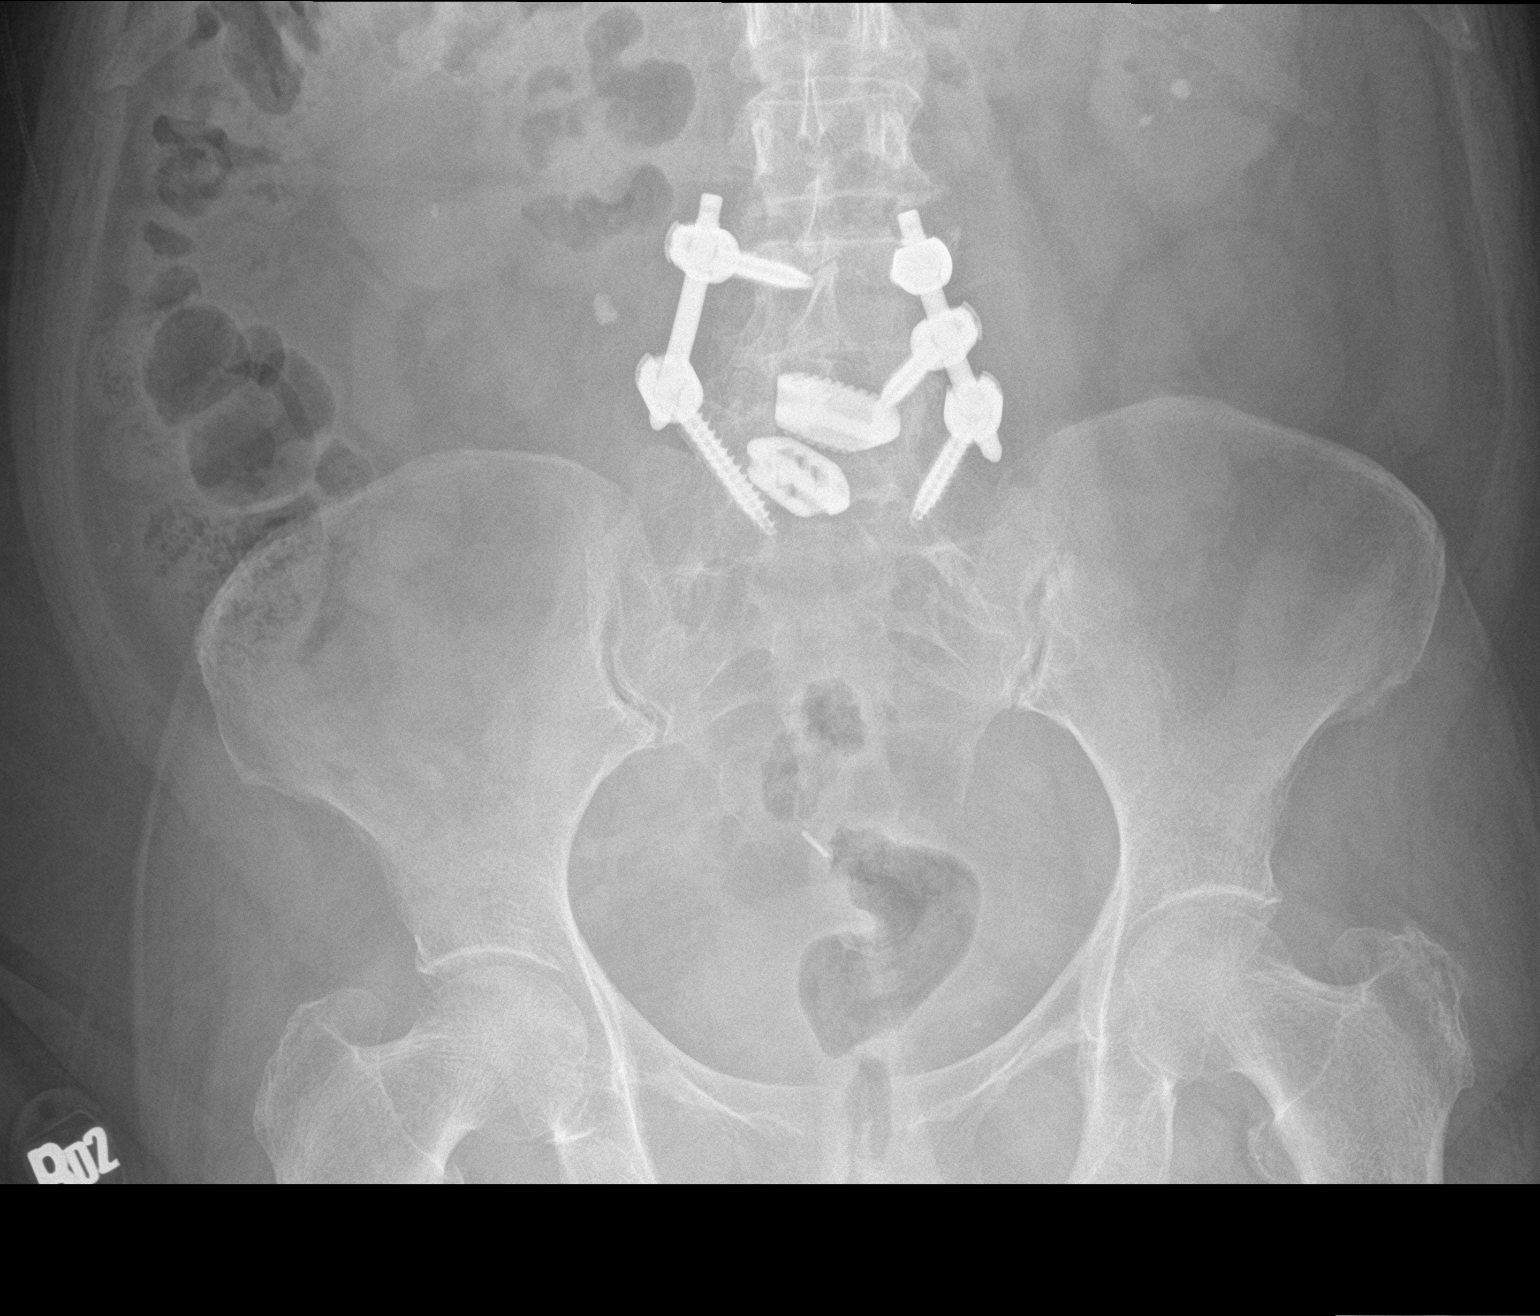

[abdomen kub (2 of 2)]
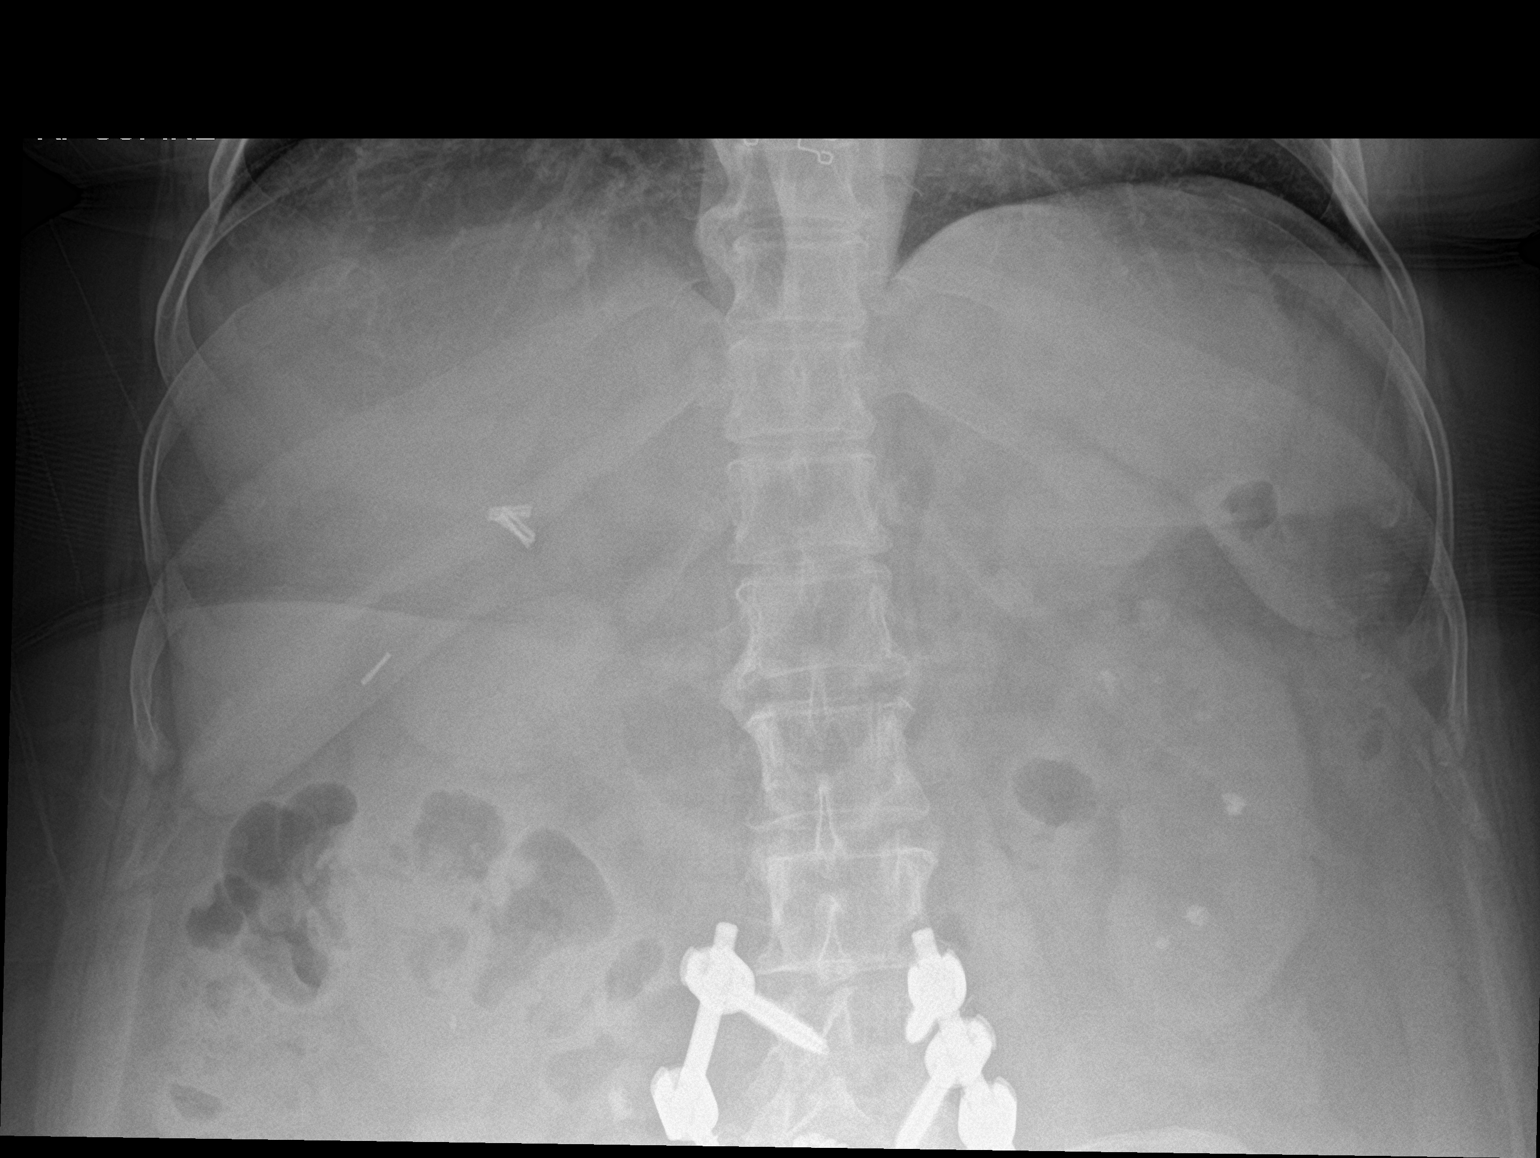

[2 of 2 positions shown; findings below may reference images not displayed]

FINDINGS: Bilateral nephrolithiasis again identified. There is a calcification
in the right lower abdomen at the L4 level measuring 8 mm, unchanged
in position. No other ureteral stones noted. No other acute
abnormalities.
IMPRESSION: 1. The calcification in the right lower abdomen is favored to
represent a ureteral stone which has not significantly changed in
position in the interval. CT imaging could confirm.
2. Bilateral nephrolithiasis, left greater than right.

## 2022-05-25 DIAGNOSIS — Z299 Encounter for prophylactic measures, unspecified: Secondary | ICD-10-CM | POA: Diagnosis not present

## 2022-05-25 DIAGNOSIS — I10 Essential (primary) hypertension: Secondary | ICD-10-CM | POA: Diagnosis not present

## 2022-05-25 DIAGNOSIS — J441 Chronic obstructive pulmonary disease with (acute) exacerbation: Secondary | ICD-10-CM | POA: Diagnosis not present

## 2022-05-31 DIAGNOSIS — J441 Chronic obstructive pulmonary disease with (acute) exacerbation: Secondary | ICD-10-CM | POA: Diagnosis not present

## 2022-05-31 DIAGNOSIS — Z299 Encounter for prophylactic measures, unspecified: Secondary | ICD-10-CM | POA: Diagnosis not present

## 2022-05-31 DIAGNOSIS — Z713 Dietary counseling and surveillance: Secondary | ICD-10-CM | POA: Diagnosis not present

## 2022-05-31 DIAGNOSIS — I10 Essential (primary) hypertension: Secondary | ICD-10-CM | POA: Diagnosis not present

## 2022-06-24 DIAGNOSIS — J441 Chronic obstructive pulmonary disease with (acute) exacerbation: Secondary | ICD-10-CM | POA: Diagnosis not present

## 2022-06-24 DIAGNOSIS — Z7189 Other specified counseling: Secondary | ICD-10-CM | POA: Diagnosis not present

## 2022-06-24 DIAGNOSIS — Z299 Encounter for prophylactic measures, unspecified: Secondary | ICD-10-CM | POA: Diagnosis not present

## 2022-06-24 DIAGNOSIS — Z1331 Encounter for screening for depression: Secondary | ICD-10-CM | POA: Diagnosis not present

## 2022-06-24 DIAGNOSIS — Z0000 Encounter for general adult medical examination without abnormal findings: Secondary | ICD-10-CM | POA: Diagnosis not present

## 2022-06-24 DIAGNOSIS — Z6835 Body mass index (BMI) 35.0-35.9, adult: Secondary | ICD-10-CM | POA: Diagnosis not present

## 2022-06-24 DIAGNOSIS — Z1339 Encounter for screening examination for other mental health and behavioral disorders: Secondary | ICD-10-CM | POA: Diagnosis not present

## 2022-06-24 DIAGNOSIS — I10 Essential (primary) hypertension: Secondary | ICD-10-CM | POA: Diagnosis not present

## 2022-06-25 DIAGNOSIS — Z79899 Other long term (current) drug therapy: Secondary | ICD-10-CM | POA: Diagnosis not present

## 2022-06-25 DIAGNOSIS — R5383 Other fatigue: Secondary | ICD-10-CM | POA: Diagnosis not present

## 2022-06-25 DIAGNOSIS — E7800 Pure hypercholesterolemia, unspecified: Secondary | ICD-10-CM | POA: Diagnosis not present

## 2022-07-15 DIAGNOSIS — R509 Fever, unspecified: Secondary | ICD-10-CM | POA: Diagnosis not present

## 2022-07-15 DIAGNOSIS — E1165 Type 2 diabetes mellitus with hyperglycemia: Secondary | ICD-10-CM | POA: Diagnosis not present

## 2022-07-15 DIAGNOSIS — I10 Essential (primary) hypertension: Secondary | ICD-10-CM | POA: Diagnosis not present

## 2022-07-15 DIAGNOSIS — M549 Dorsalgia, unspecified: Secondary | ICD-10-CM | POA: Diagnosis not present

## 2022-07-15 DIAGNOSIS — R059 Cough, unspecified: Secondary | ICD-10-CM | POA: Diagnosis not present

## 2022-07-15 DIAGNOSIS — Z299 Encounter for prophylactic measures, unspecified: Secondary | ICD-10-CM | POA: Diagnosis not present

## 2022-07-19 DIAGNOSIS — H26493 Other secondary cataract, bilateral: Secondary | ICD-10-CM | POA: Diagnosis not present

## 2022-10-11 DIAGNOSIS — E1142 Type 2 diabetes mellitus with diabetic polyneuropathy: Secondary | ICD-10-CM | POA: Diagnosis not present

## 2022-10-11 DIAGNOSIS — J441 Chronic obstructive pulmonary disease with (acute) exacerbation: Secondary | ICD-10-CM | POA: Diagnosis not present

## 2022-10-11 DIAGNOSIS — Z299 Encounter for prophylactic measures, unspecified: Secondary | ICD-10-CM | POA: Diagnosis not present

## 2022-10-11 DIAGNOSIS — F332 Major depressive disorder, recurrent severe without psychotic features: Secondary | ICD-10-CM | POA: Diagnosis not present

## 2022-10-11 DIAGNOSIS — R6889 Other general symptoms and signs: Secondary | ICD-10-CM | POA: Diagnosis not present

## 2022-10-20 DIAGNOSIS — K219 Gastro-esophageal reflux disease without esophagitis: Secondary | ICD-10-CM | POA: Diagnosis not present

## 2022-10-20 DIAGNOSIS — M25561 Pain in right knee: Secondary | ICD-10-CM | POA: Diagnosis not present

## 2022-10-20 DIAGNOSIS — J309 Allergic rhinitis, unspecified: Secondary | ICD-10-CM | POA: Diagnosis not present

## 2022-10-20 DIAGNOSIS — Z299 Encounter for prophylactic measures, unspecified: Secondary | ICD-10-CM | POA: Diagnosis not present

## 2022-10-20 DIAGNOSIS — E1165 Type 2 diabetes mellitus with hyperglycemia: Secondary | ICD-10-CM | POA: Diagnosis not present

## 2022-10-20 DIAGNOSIS — I10 Essential (primary) hypertension: Secondary | ICD-10-CM | POA: Diagnosis not present

## 2023-01-19 ENCOUNTER — Other Ambulatory Visit: Payer: Self-pay | Admitting: Internal Medicine

## 2023-01-19 DIAGNOSIS — Z1231 Encounter for screening mammogram for malignant neoplasm of breast: Secondary | ICD-10-CM

## 2023-01-21 DIAGNOSIS — F331 Major depressive disorder, recurrent, moderate: Secondary | ICD-10-CM | POA: Diagnosis not present

## 2023-01-21 DIAGNOSIS — Z299 Encounter for prophylactic measures, unspecified: Secondary | ICD-10-CM | POA: Diagnosis not present

## 2023-01-21 DIAGNOSIS — E1142 Type 2 diabetes mellitus with diabetic polyneuropathy: Secondary | ICD-10-CM | POA: Diagnosis not present

## 2023-01-21 DIAGNOSIS — Z0000 Encounter for general adult medical examination without abnormal findings: Secondary | ICD-10-CM | POA: Diagnosis not present

## 2023-01-21 DIAGNOSIS — E1165 Type 2 diabetes mellitus with hyperglycemia: Secondary | ICD-10-CM | POA: Diagnosis not present

## 2023-01-21 DIAGNOSIS — I10 Essential (primary) hypertension: Secondary | ICD-10-CM | POA: Diagnosis not present

## 2023-02-04 DIAGNOSIS — G4483 Primary cough headache: Secondary | ICD-10-CM | POA: Diagnosis not present

## 2023-02-04 DIAGNOSIS — J441 Chronic obstructive pulmonary disease with (acute) exacerbation: Secondary | ICD-10-CM | POA: Diagnosis not present

## 2023-02-04 DIAGNOSIS — J069 Acute upper respiratory infection, unspecified: Secondary | ICD-10-CM | POA: Diagnosis not present

## 2023-02-04 DIAGNOSIS — Z299 Encounter for prophylactic measures, unspecified: Secondary | ICD-10-CM | POA: Diagnosis not present

## 2023-03-11 ENCOUNTER — Ambulatory Visit: Payer: Medicare Other

## 2023-03-22 ENCOUNTER — Ambulatory Visit
Admission: RE | Admit: 2023-03-22 | Discharge: 2023-03-22 | Disposition: A | Discharge status: Home / Self Care | Payer: Medicare PPO | Source: Ambulatory Visit | Attending: Internal Medicine | Admitting: Internal Medicine

## 2023-03-22 DIAGNOSIS — Z1231 Encounter for screening mammogram for malignant neoplasm of breast: Secondary | ICD-10-CM

## 2023-03-22 DIAGNOSIS — R058 Other specified cough: Secondary | ICD-10-CM | POA: Diagnosis not present

## 2023-03-22 DIAGNOSIS — L408 Other psoriasis: Secondary | ICD-10-CM | POA: Diagnosis not present

## 2023-03-22 DIAGNOSIS — Z299 Encounter for prophylactic measures, unspecified: Secondary | ICD-10-CM | POA: Diagnosis not present

## 2023-03-22 DIAGNOSIS — J441 Chronic obstructive pulmonary disease with (acute) exacerbation: Secondary | ICD-10-CM | POA: Diagnosis not present

## 2023-04-07 ENCOUNTER — Ambulatory Visit: Payer: Medicare HMO

## 2023-04-27 DIAGNOSIS — Z299 Encounter for prophylactic measures, unspecified: Secondary | ICD-10-CM | POA: Diagnosis not present

## 2023-04-27 DIAGNOSIS — R52 Pain, unspecified: Secondary | ICD-10-CM | POA: Diagnosis not present

## 2023-04-27 DIAGNOSIS — I10 Essential (primary) hypertension: Secondary | ICD-10-CM | POA: Diagnosis not present

## 2023-04-27 DIAGNOSIS — J449 Chronic obstructive pulmonary disease, unspecified: Secondary | ICD-10-CM | POA: Diagnosis not present

## 2023-04-27 DIAGNOSIS — E1169 Type 2 diabetes mellitus with other specified complication: Secondary | ICD-10-CM | POA: Diagnosis not present

## 2023-04-27 DIAGNOSIS — E7800 Pure hypercholesterolemia, unspecified: Secondary | ICD-10-CM | POA: Diagnosis not present

## 2023-05-31 DIAGNOSIS — R609 Edema, unspecified: Secondary | ICD-10-CM | POA: Diagnosis not present

## 2023-05-31 DIAGNOSIS — R058 Other specified cough: Secondary | ICD-10-CM | POA: Diagnosis not present

## 2023-05-31 DIAGNOSIS — Z299 Encounter for prophylactic measures, unspecified: Secondary | ICD-10-CM | POA: Diagnosis not present

## 2023-05-31 DIAGNOSIS — J441 Chronic obstructive pulmonary disease with (acute) exacerbation: Secondary | ICD-10-CM | POA: Diagnosis not present

## 2023-06-16 DIAGNOSIS — Z299 Encounter for prophylactic measures, unspecified: Secondary | ICD-10-CM | POA: Diagnosis not present

## 2023-06-16 DIAGNOSIS — J441 Chronic obstructive pulmonary disease with (acute) exacerbation: Secondary | ICD-10-CM | POA: Diagnosis not present

## 2023-08-09 DIAGNOSIS — Z299 Encounter for prophylactic measures, unspecified: Secondary | ICD-10-CM | POA: Diagnosis not present

## 2023-08-09 DIAGNOSIS — R059 Cough, unspecified: Secondary | ICD-10-CM | POA: Diagnosis not present

## 2023-08-09 DIAGNOSIS — J449 Chronic obstructive pulmonary disease, unspecified: Secondary | ICD-10-CM | POA: Diagnosis not present

## 2023-08-09 DIAGNOSIS — R0981 Nasal congestion: Secondary | ICD-10-CM | POA: Diagnosis not present

## 2023-10-06 DIAGNOSIS — Z6836 Body mass index (BMI) 36.0-36.9, adult: Secondary | ICD-10-CM | POA: Diagnosis not present

## 2023-10-06 DIAGNOSIS — I10 Essential (primary) hypertension: Secondary | ICD-10-CM | POA: Diagnosis not present

## 2023-10-06 DIAGNOSIS — R52 Pain, unspecified: Secondary | ICD-10-CM | POA: Diagnosis not present

## 2023-10-06 DIAGNOSIS — Z299 Encounter for prophylactic measures, unspecified: Secondary | ICD-10-CM | POA: Diagnosis not present

## 2023-10-06 DIAGNOSIS — E1169 Type 2 diabetes mellitus with other specified complication: Secondary | ICD-10-CM | POA: Diagnosis not present

## 2023-10-06 DIAGNOSIS — J449 Chronic obstructive pulmonary disease, unspecified: Secondary | ICD-10-CM | POA: Diagnosis not present

## 2023-11-17 DIAGNOSIS — E1169 Type 2 diabetes mellitus with other specified complication: Secondary | ICD-10-CM | POA: Diagnosis not present

## 2023-11-17 DIAGNOSIS — Z6834 Body mass index (BMI) 34.0-34.9, adult: Secondary | ICD-10-CM | POA: Diagnosis not present

## 2023-11-17 DIAGNOSIS — R519 Headache, unspecified: Secondary | ICD-10-CM | POA: Diagnosis not present

## 2023-11-17 DIAGNOSIS — R110 Nausea: Secondary | ICD-10-CM | POA: Diagnosis not present

## 2023-11-17 DIAGNOSIS — I10 Essential (primary) hypertension: Secondary | ICD-10-CM | POA: Diagnosis not present

## 2023-11-17 DIAGNOSIS — Z299 Encounter for prophylactic measures, unspecified: Secondary | ICD-10-CM | POA: Diagnosis not present

## 2023-12-19 DIAGNOSIS — K5900 Constipation, unspecified: Secondary | ICD-10-CM | POA: Diagnosis not present

## 2023-12-19 DIAGNOSIS — N390 Urinary tract infection, site not specified: Secondary | ICD-10-CM | POA: Diagnosis not present

## 2023-12-19 DIAGNOSIS — R350 Frequency of micturition: Secondary | ICD-10-CM | POA: Diagnosis not present

## 2023-12-19 DIAGNOSIS — Z299 Encounter for prophylactic measures, unspecified: Secondary | ICD-10-CM | POA: Diagnosis not present

## 2023-12-19 DIAGNOSIS — R52 Pain, unspecified: Secondary | ICD-10-CM | POA: Diagnosis not present

## 2023-12-19 DIAGNOSIS — E1169 Type 2 diabetes mellitus with other specified complication: Secondary | ICD-10-CM | POA: Diagnosis not present

## 2023-12-19 DIAGNOSIS — J449 Chronic obstructive pulmonary disease, unspecified: Secondary | ICD-10-CM | POA: Diagnosis not present

## 2023-12-19 DIAGNOSIS — I10 Essential (primary) hypertension: Secondary | ICD-10-CM | POA: Diagnosis not present

## 2023-12-26 DIAGNOSIS — E119 Type 2 diabetes mellitus without complications: Secondary | ICD-10-CM | POA: Diagnosis not present

## 2024-01-12 DIAGNOSIS — E119 Type 2 diabetes mellitus without complications: Secondary | ICD-10-CM | POA: Diagnosis not present

## 2024-01-12 DIAGNOSIS — R52 Pain, unspecified: Secondary | ICD-10-CM | POA: Diagnosis not present

## 2024-01-12 DIAGNOSIS — I10 Essential (primary) hypertension: Secondary | ICD-10-CM | POA: Diagnosis not present

## 2024-01-12 DIAGNOSIS — F331 Major depressive disorder, recurrent, moderate: Secondary | ICD-10-CM | POA: Diagnosis not present

## 2024-01-12 DIAGNOSIS — Z299 Encounter for prophylactic measures, unspecified: Secondary | ICD-10-CM | POA: Diagnosis not present

## 2024-01-12 DIAGNOSIS — J441 Chronic obstructive pulmonary disease with (acute) exacerbation: Secondary | ICD-10-CM | POA: Diagnosis not present

## 2024-01-23 DIAGNOSIS — Z800 Family history of malignant neoplasm of digestive organs: Secondary | ICD-10-CM | POA: Diagnosis not present

## 2024-01-23 DIAGNOSIS — Z09 Encounter for follow-up examination after completed treatment for conditions other than malignant neoplasm: Secondary | ICD-10-CM | POA: Diagnosis not present

## 2024-02-10 DIAGNOSIS — Z299 Encounter for prophylactic measures, unspecified: Secondary | ICD-10-CM | POA: Diagnosis not present

## 2024-02-10 DIAGNOSIS — R52 Pain, unspecified: Secondary | ICD-10-CM | POA: Diagnosis not present

## 2024-02-10 DIAGNOSIS — I10 Essential (primary) hypertension: Secondary | ICD-10-CM | POA: Diagnosis not present

## 2024-02-10 DIAGNOSIS — J449 Chronic obstructive pulmonary disease, unspecified: Secondary | ICD-10-CM | POA: Diagnosis not present

## 2024-02-10 DIAGNOSIS — E1169 Type 2 diabetes mellitus with other specified complication: Secondary | ICD-10-CM | POA: Diagnosis not present

## 2024-02-10 DIAGNOSIS — Z0000 Encounter for general adult medical examination without abnormal findings: Secondary | ICD-10-CM | POA: Diagnosis not present

## 2024-02-14 ENCOUNTER — Other Ambulatory Visit: Payer: Self-pay | Admitting: Internal Medicine

## 2024-02-14 DIAGNOSIS — E1169 Type 2 diabetes mellitus with other specified complication: Secondary | ICD-10-CM | POA: Diagnosis not present

## 2024-02-14 DIAGNOSIS — Z1231 Encounter for screening mammogram for malignant neoplasm of breast: Secondary | ICD-10-CM

## 2024-02-24 DIAGNOSIS — E2839 Other primary ovarian failure: Secondary | ICD-10-CM | POA: Diagnosis not present

## 2024-03-01 DIAGNOSIS — K635 Polyp of colon: Secondary | ICD-10-CM | POA: Diagnosis not present

## 2024-03-01 DIAGNOSIS — F32A Depression, unspecified: Secondary | ICD-10-CM | POA: Diagnosis not present

## 2024-03-01 DIAGNOSIS — E119 Type 2 diabetes mellitus without complications: Secondary | ICD-10-CM | POA: Diagnosis not present

## 2024-03-01 DIAGNOSIS — K641 Second degree hemorrhoids: Secondary | ICD-10-CM | POA: Diagnosis not present

## 2024-03-01 DIAGNOSIS — K219 Gastro-esophageal reflux disease without esophagitis: Secondary | ICD-10-CM | POA: Diagnosis not present

## 2024-03-01 DIAGNOSIS — D122 Benign neoplasm of ascending colon: Secondary | ICD-10-CM | POA: Diagnosis not present

## 2024-03-01 DIAGNOSIS — Z800 Family history of malignant neoplasm of digestive organs: Secondary | ICD-10-CM | POA: Diagnosis not present

## 2024-03-01 DIAGNOSIS — J449 Chronic obstructive pulmonary disease, unspecified: Secondary | ICD-10-CM | POA: Diagnosis not present

## 2024-03-01 DIAGNOSIS — K640 First degree hemorrhoids: Secondary | ICD-10-CM | POA: Diagnosis not present

## 2024-03-01 DIAGNOSIS — Z79899 Other long term (current) drug therapy: Secondary | ICD-10-CM | POA: Diagnosis not present

## 2024-03-01 DIAGNOSIS — I10 Essential (primary) hypertension: Secondary | ICD-10-CM | POA: Diagnosis not present

## 2024-03-19 DIAGNOSIS — D128 Benign neoplasm of rectum: Secondary | ICD-10-CM | POA: Diagnosis not present

## 2024-03-28 ENCOUNTER — Encounter

## 2024-04-04 DIAGNOSIS — Z23 Encounter for immunization: Secondary | ICD-10-CM | POA: Diagnosis not present

## 2024-05-02 ENCOUNTER — Inpatient Hospital Stay: Admission: RE | Admit: 2024-05-02 | Source: Ambulatory Visit

## 2024-05-02 ENCOUNTER — Ambulatory Visit
Admission: RE | Admit: 2024-05-02 | Discharge: 2024-05-02 | Disposition: A | Source: Ambulatory Visit | Attending: Internal Medicine | Admitting: Internal Medicine

## 2024-05-02 DIAGNOSIS — Z713 Dietary counseling and surveillance: Secondary | ICD-10-CM | POA: Diagnosis not present

## 2024-05-02 DIAGNOSIS — Z1231 Encounter for screening mammogram for malignant neoplasm of breast: Secondary | ICD-10-CM

## 2024-05-02 DIAGNOSIS — E8940 Asymptomatic postprocedural ovarian failure: Secondary | ICD-10-CM | POA: Diagnosis not present

## 2024-05-02 DIAGNOSIS — Z1339 Encounter for screening examination for other mental health and behavioral disorders: Secondary | ICD-10-CM | POA: Diagnosis not present

## 2024-05-02 DIAGNOSIS — I10 Essential (primary) hypertension: Secondary | ICD-10-CM | POA: Diagnosis not present

## 2024-05-02 DIAGNOSIS — E119 Type 2 diabetes mellitus without complications: Secondary | ICD-10-CM | POA: Diagnosis not present

## 2024-05-02 DIAGNOSIS — Z0000 Encounter for general adult medical examination without abnormal findings: Secondary | ICD-10-CM | POA: Diagnosis not present

## 2024-05-02 DIAGNOSIS — Z7189 Other specified counseling: Secondary | ICD-10-CM | POA: Diagnosis not present

## 2024-05-02 DIAGNOSIS — Z299 Encounter for prophylactic measures, unspecified: Secondary | ICD-10-CM | POA: Diagnosis not present

## 2024-05-02 DIAGNOSIS — Z1331 Encounter for screening for depression: Secondary | ICD-10-CM | POA: Diagnosis not present
